# Patient Record
Sex: Female | Born: 1985
Health system: Southern US, Community
[De-identification: ages and names within clinical notes are randomized; demographics above are authoritative.]

## PROBLEM LIST (undated history)

## (undated) ENCOUNTER — Inpatient Hospital Stay (HOSPITAL_COMMUNITY): Payer: Self-pay

## (undated) DIAGNOSIS — T7840XA Allergy, unspecified, initial encounter: Secondary | ICD-10-CM

## (undated) DIAGNOSIS — E079 Disorder of thyroid, unspecified: Secondary | ICD-10-CM

## (undated) DIAGNOSIS — K219 Gastro-esophageal reflux disease without esophagitis: Secondary | ICD-10-CM

## (undated) DIAGNOSIS — L509 Urticaria, unspecified: Secondary | ICD-10-CM

## (undated) DIAGNOSIS — T783XXA Angioneurotic edema, initial encounter: Secondary | ICD-10-CM

## (undated) HISTORY — DX: Disorder of thyroid, unspecified: E07.9

## (undated) HISTORY — DX: Urticaria, unspecified: L50.9

## (undated) HISTORY — DX: Allergy, unspecified, initial encounter: T78.40XA

## (undated) HISTORY — DX: Angioneurotic edema, initial encounter: T78.3XXA

## (undated) HISTORY — DX: Gastro-esophageal reflux disease without esophagitis: K21.9

---

## 2009-08-15 ENCOUNTER — Other Ambulatory Visit: Admission: RE | Admit: 2009-08-15 | Discharge: 2009-08-15 | Payer: Self-pay | Admitting: Obstetrics and Gynecology

## 2010-03-16 ENCOUNTER — Inpatient Hospital Stay (HOSPITAL_COMMUNITY): Admission: RE | Admit: 2010-03-16 | Discharge: 2010-03-18 | Payer: Self-pay | Admitting: Obstetrics & Gynecology

## 2010-03-16 ENCOUNTER — Ambulatory Visit: Payer: Self-pay | Admitting: Advanced Practice Midwife

## 2010-12-17 LAB — GLUCOSE, CAPILLARY
Glucose-Capillary: 165 mg/dL — ABNORMAL HIGH (ref 70–99)
Glucose-Capillary: 73 mg/dL (ref 70–99)
Glucose-Capillary: 77 mg/dL (ref 70–99)
Glucose-Capillary: 86 mg/dL (ref 70–99)

## 2010-12-17 LAB — GLUCOSE, RANDOM: Glucose, Bld: 174 mg/dL — ABNORMAL HIGH (ref 70–99)

## 2010-12-17 LAB — CBC
HCT: 34.2 % — ABNORMAL LOW (ref 36.0–46.0)
Hemoglobin: 11.9 g/dL — ABNORMAL LOW (ref 12.0–15.0)
Hemoglobin: 12.8 g/dL (ref 12.0–15.0)
MCHC: 34.9 g/dL (ref 30.0–36.0)
MCV: 95.6 fL (ref 78.0–100.0)
RBC: 3.88 MIL/uL (ref 3.87–5.11)
WBC: 7.6 10*3/uL (ref 4.0–10.5)

## 2012-06-03 ENCOUNTER — Other Ambulatory Visit (HOSPITAL_COMMUNITY)
Admission: RE | Admit: 2012-06-03 | Discharge: 2012-06-03 | Disposition: A | Discharge status: Home / Self Care | Payer: Medicaid Other | Source: Ambulatory Visit | Attending: Obstetrics and Gynecology | Admitting: Obstetrics and Gynecology

## 2012-06-03 DIAGNOSIS — Z01419 Encounter for gynecological examination (general) (routine) without abnormal findings: Secondary | ICD-10-CM | POA: Insufficient documentation

## 2012-06-03 DIAGNOSIS — Z113 Encounter for screening for infections with a predominantly sexual mode of transmission: Secondary | ICD-10-CM | POA: Insufficient documentation

## 2012-06-03 LAB — OB RESULTS CONSOLE ANTIBODY SCREEN: Antibody Screen: NEGATIVE

## 2012-06-03 LAB — OB RESULTS CONSOLE GC/CHLAMYDIA: Gonorrhea: NEGATIVE

## 2012-06-03 LAB — OB RESULTS CONSOLE VARICELLA ZOSTER ANTIBODY, IGG: Varicella: IMMUNE

## 2012-10-01 NOTE — L&D Delivery Note (Signed)
Delivery Note At 12:26 PM a viable female was delivered via Vaginal, Spontaneous Delivery (Presentation: Left Occiput Anterior, w/ L hand presentation by face).  APGAR: 8 ,9 ; weight "pending" .   Placenta status: Intact, Spontaneous.  Cord: 3 vessels with the following complications: Short.   Anesthesia: None  Episiotomy: none Lacerations: 1st degree perineal Suture Repair: 3.0 vicryl Est. Blood Loss (mL): 200  Mom to postpartum.  Baby to nursery-stable.  Liberty Seto, MD Family Medicine Resident PGY-2 01/11/2013, 12:48 PM

## 2012-10-01 NOTE — L&D Delivery Note (Signed)
I was present for entire delivery and agree with above resident note. No complications. Small 1st degree tear, mostly superficial.  Napoleon Form, MD

## 2012-12-03 ENCOUNTER — Encounter: Payer: Self-pay | Admitting: *Deleted

## 2012-12-22 ENCOUNTER — Ambulatory Visit (INDEPENDENT_AMBULATORY_CARE_PROVIDER_SITE_OTHER): Payer: Medicaid Other | Admitting: Obstetrics & Gynecology

## 2012-12-22 ENCOUNTER — Encounter: Payer: Self-pay | Admitting: Obstetrics & Gynecology

## 2012-12-22 VITALS — BP 98/64 | Wt 140.0 lb

## 2012-12-22 DIAGNOSIS — Z1389 Encounter for screening for other disorder: Secondary | ICD-10-CM

## 2012-12-22 DIAGNOSIS — Z3483 Encounter for supervision of other normal pregnancy, third trimester: Secondary | ICD-10-CM

## 2012-12-22 DIAGNOSIS — O09299 Supervision of pregnancy with other poor reproductive or obstetric history, unspecified trimester: Secondary | ICD-10-CM

## 2012-12-22 DIAGNOSIS — O321XX0 Maternal care for breech presentation, not applicable or unspecified: Secondary | ICD-10-CM

## 2012-12-22 DIAGNOSIS — O99810 Abnormal glucose complicating pregnancy: Secondary | ICD-10-CM

## 2012-12-22 DIAGNOSIS — O0993 Supervision of high risk pregnancy, unspecified, third trimester: Secondary | ICD-10-CM

## 2012-12-22 DIAGNOSIS — Z331 Pregnant state, incidental: Secondary | ICD-10-CM

## 2012-12-22 DIAGNOSIS — O329XX0 Maternal care for malpresentation of fetus, unspecified, not applicable or unspecified: Secondary | ICD-10-CM

## 2012-12-22 LAB — POCT URINALYSIS DIPSTICK
Ketones, UA: NEGATIVE
Nitrite, UA: NEGATIVE
Protein, UA: NEGATIVE

## 2012-12-22 NOTE — Patient Instructions (Signed)
Breech Delivery A breech birth is when a baby is born with the buttocks or the feet first. Most babies are in a head down (vertex) position when they are born. Many babies are breech early in the pregnancy but turn to headfirst position toward the end of the pregnancy. There are three types of breech babies that can pose a risk to the baby and may need delivery by cesarean delivery. These include:  When the baby's buttocks is showing first in the birth canal (vagina) with the legs straight up and the feet at the baby's head (frank breech).  When the baby's buttocks shows first with the legs bent at the knees and the feet down near the buttocks (complete breech).  When one or both of the baby's feet are down below the buttocks (footling breech). RISKS WITH BREECH DELIVERIES:  Umbilical cord prolapse. This is when the umbilical cord is in front of the baby before or during labor.  Injury to the nerves in the shoulder, arm and hand (Brachial Plexus injury) when delivered.  Birth defects, overly large head (hydrocephalic) and neural tube defects (spina bifida) are seen more often.  Premature babies are seen more often. These are babies that are born too early.  There is an increase rate for cesarean delivery with breech babies. CAUSE OF BREECH PREGNANCY:  The mother has had several babies.  Having twins or more.  The uterus is abnormal in shape and size (double uterus).  The baby weighs less than 5 pounds.  There is too much or not enough amniotic fluid.  There is a tumor in the uterus.  All or part of the placenta covers the opening of the uterus (placenta previa). DIAGNOSIS  When you are close to your due date, your caregiver can tell if your baby is breech by an abdominal or vaginal exam, an X-ray or ultrasound, or any combination both. TREATMENT   More problems are likely if the baby is breech, but some babies may be delivered safely without a cesarean delivery. A cesarean  delivery also has risks such as longer hospital stays, blood clots, infection or bleeding. A breech delivery can compress the umbilical cord or cut off the blood supply to the baby. This can cause brain damage or death to the baby.  You and your caregiver will discuss the best way to deliver the baby, in your particular circumstance.  Your caregiver may try to turn the baby in your uterus. This is done towards the end of a normal pregnancy with your caregiver placing both hands on the abdomen, gently and slowly turning the baby around. This is a procedure called external cephalic version (ECV). If this procedure is successful, and the baby stays head down, a normal delivery is more likely.  Some doctors will plan to deliver a breech baby by cesarean delivery. An ECV should be attempted only when:  The pregnancy is more than [redacted] weeks along.  The breech should be confirmed with an ultrasound.  ECV should only be done in a delivery/surgical room with an anesthesiologist, surgical nurses and pediatric nursery nurses and preferably a pediatrician present. All should be ready for an emergency cesarean delivery if necessary.  A medication is given before the ECV is attempted to relax the muscles of the uterus.  A nonstress test on the baby should be normal.  A biophysical profile on the baby should be normal.  The baby should have continuous monitoring during the ECV.  Using regional anesthesia (epidural) helps   the outcome of the ECV to be more successful.  The baby should be monitored for 1 to 2 hours after the procedure. The benefits of ECV are:   Lower risk to the baby when delivered head first.  Lower cost.  Decrease in rate of cesarean delivery. The ECV should not be used if you have:  Vaginal bleeding.  Placenta previa.  A nonreactive nonstress test of the baby.  An abnormal biophysical profile of the baby.  An abnormally small baby.  A low level of fluid in the sac that  surrounds and protects the baby.  An abnormal fetal heart rate.  Early rupture of the membranes.  Twins or other multiple pregnancy.  An abnormal shape uterus, tumor or genetic defect (double uterus). More than half of external cephalic versions work. Even when they work, there is always a chance that the baby will turn back to the breech position.  HOME CARE INSTRUCTIONS  When you are pregnant:  See your caregiver regularly.  Take your prenatal vitamins as suggested.  Eat a well-balanced diet and exercise regularly unless instructed otherwise.  Get plenty of rest and sleep. After you have the baby:  You may have a small amount of bleeding for a week or so.  You may have some cramping of the uterus, especially if you are nursing.  Do not have sexual intercourse until your caregiver says it is okay.  Do not use tampons or douche.  Do not take aspirin because it can cause bleeding.  If you had a cesarean delivery, you may have some pain in the cut (incision). Your caregiver will advise you or give you pain medication.  Do not lift anything over 5 pounds.  Try to have help at home for 2 to 3 weeks.  Keep all your post delivery appointments. SEEK IMMEDIATE MEDICAL CARE IF:  Before you have the baby:  You have vaginal bleeding.  You are leaking fluid or have a gush of fluid from the vagina.  You develop uterine contractions.  You develop a temperature of 100 F (37.8 C) or higher.  You feel the baby is not moving or is moving less than normal. After you have the baby:  You develop a temperature of 100 F (37.8 C) or higher.  You have heavy vaginal bleeding.  You develop abnormal vaginal discharge.  You have pain when you urinate.  You become light headed or faint.  You have leg pain, chest pain or shortness of breath.  If you had a cesarean delivery and develop redness and swelling around the incision. You see fluid (pus) coming from the incision. Call  your caregiver if you think you are developing any problems during the pregnancy or after you have the baby. Document Released: 11/08/2006 Document Revised: 12/10/2011 Document Reviewed: 05/05/2008 Exit[employer] Patient Information 2013 [employer].  

## 2012-12-22 NOTE — Progress Notes (Signed)
Patient reports good fetal movement, denies any bleeding and no rupture of membranes symptoms or contraction. Breech, scheduled for external cephalic version 1/61/0960  No other complaints.

## 2012-12-22 NOTE — Progress Notes (Signed)
Swelling in hands and feet. 

## 2012-12-24 ENCOUNTER — Observation Stay (HOSPITAL_COMMUNITY)
Admission: AD | Admit: 2012-12-24 | Discharge: 2012-12-24 | Disposition: A | Discharge status: Home / Self Care | Payer: Medicaid Other | Source: Ambulatory Visit | Attending: Obstetrics & Gynecology | Admitting: Obstetrics & Gynecology

## 2012-12-24 DIAGNOSIS — O329XX3 Maternal care for malpresentation of fetus, unspecified, fetus 3: Secondary | ICD-10-CM

## 2012-12-24 DIAGNOSIS — O321XX0 Maternal care for breech presentation, not applicable or unspecified: Principal | ICD-10-CM | POA: Insufficient documentation

## 2012-12-24 DIAGNOSIS — O329XX0 Maternal care for malpresentation of fetus, unspecified, not applicable or unspecified: Secondary | ICD-10-CM

## 2012-12-24 DIAGNOSIS — Z3483 Encounter for supervision of other normal pregnancy, third trimester: Secondary | ICD-10-CM

## 2012-12-24 DIAGNOSIS — IMO0002 Reserved for concepts with insufficient information to code with codable children: Secondary | ICD-10-CM

## 2012-12-24 LAB — CBC
HCT: 33.5 % — ABNORMAL LOW (ref 36.0–46.0)
MCH: 28.4 pg (ref 26.0–34.0)
MCV: 84.8 fL (ref 78.0–100.0)
RBC: 3.95 MIL/uL (ref 3.87–5.11)
WBC: 8 10*3/uL (ref 4.0–10.5)

## 2012-12-24 MED ORDER — TERBUTALINE SULFATE 1 MG/ML IJ SOLN
0.2500 mg | Freq: Once | INTRAMUSCULAR | Status: AC
  Administered 2012-12-24: 0.25 mg via SUBCUTANEOUS

## 2012-12-24 MED ORDER — TERBUTALINE SULFATE 1 MG/ML IJ SOLN: 0.2500 mg | Freq: Once | INTRAMUSCULAR | Status: DC

## 2012-12-24 MED ORDER — TERBUTALINE SULFATE 1 MG/ML IJ SOLN
INTRAMUSCULAR | Status: AC
  Administered 2012-12-24: 1 mg
  Filled 2012-12-24: qty 1

## 2012-12-24 MED ORDER — LACTATED RINGERS IV SOLN
INTRAVENOUS | Status: DC
  Administered 2012-12-24: 19:00:00 via INTRAVENOUS

## 2012-12-24 NOTE — Progress Notes (Signed)
Discharge instructions reviewed with patient.

## 2012-12-24 NOTE — Progress Notes (Signed)
PIV in left wrist removed.  PIV started on previous shift.

## 2012-12-24 NOTE — Progress Notes (Signed)
External cephalic version performed in the usual fashion as a forward roll.  NST reassuring. No complications.  Will monitor at least an hour and discharge if stable.  Keep appt in office next week.

## 2012-12-24 NOTE — H&P (Signed)
Johnnae Impastato is a 27 y.o. female [phone] @ [redacted]w[redacted]d found to be breech in office 2 days ago.  Here for external cephalic version. History OB History   Grav Para Term Preterm Abortions TAB SAB Ect Mult Living   4 2 2  1  1   2      No past medical history on file. No past surgical history on file. Family History: family history is not on file. Social History:  reports that she has never smoked. She does not have any smokeless tobacco history on file. She reports that  drinks alcohol. She reports that she does not use illicit drugs.    ROS    Blood pressure 106/63, pulse 101, temperature 97.6 F (36.4 C), temperature source Oral, resp. rate 18, height 5' (1.524 m), weight 140 lb (63.504 kg), last menstrual period 03/19/2012, SpO2 100.00%. Exam Physical Exam  Prenatal labs: ABO, Rh: O/Positive/-- (09/03 0000) Antibody: Negative (09/03 0000) Rubella: Immune (09/03 0000) RPR: Nonreactive (09/03 0000)  HBsAg: Negative (09/03 0000)  HIV: Non-reactive (09/03 0000)  GBS:      I performed bedside sonogram and confirmed breech, frank, fundal placenta, good fluid.  NST is reactive. Assessment/Plan: 37 4/[redacted] weeks gestation , breech   EURE,LUTHER H 12/24/2012, 8:53 PM

## 2012-12-24 NOTE — Discharge Summary (Signed)
Patient in for external cephalic version at 37+ weeks, successful  Reactive NST after procedure.  No contractions  Pt to o home and follow up as scheduled next Monday in office.  EURE,LUTHER H 12/24/2012 10:28 PM

## 2012-12-26 LAB — OB RESULTS CONSOLE GBS: GBS: NEGATIVE

## 2012-12-27 ENCOUNTER — Inpatient Hospital Stay (HOSPITAL_COMMUNITY)
Admission: AD | Admit: 2012-12-27 | Discharge: 2012-12-28 | Disposition: A | Discharge status: Home / Self Care | Payer: Medicaid Other | Source: Ambulatory Visit | Attending: Obstetrics & Gynecology | Admitting: Obstetrics & Gynecology

## 2012-12-27 ENCOUNTER — Encounter (HOSPITAL_COMMUNITY): Payer: Self-pay | Admitting: *Deleted

## 2012-12-27 DIAGNOSIS — O479 False labor, unspecified: Secondary | ICD-10-CM | POA: Insufficient documentation

## 2012-12-27 DIAGNOSIS — O2390 Unspecified genitourinary tract infection in pregnancy, unspecified trimester: Secondary | ICD-10-CM | POA: Insufficient documentation

## 2012-12-27 DIAGNOSIS — N390 Urinary tract infection, site not specified: Secondary | ICD-10-CM | POA: Insufficient documentation

## 2012-12-27 DIAGNOSIS — O2343 Unspecified infection of urinary tract in pregnancy, third trimester: Secondary | ICD-10-CM

## 2012-12-27 DIAGNOSIS — Z3481 Encounter for supervision of other normal pregnancy, first trimester: Secondary | ICD-10-CM

## 2012-12-27 DIAGNOSIS — O99810 Abnormal glucose complicating pregnancy: Secondary | ICD-10-CM | POA: Insufficient documentation

## 2012-12-27 LAB — URINALYSIS, ROUTINE W REFLEX MICROSCOPIC
Bilirubin Urine: NEGATIVE
Hgb urine dipstick: NEGATIVE
Ketones, ur: NEGATIVE mg/dL
Specific Gravity, Urine: <1.005 — ABNORMAL LOW (ref 1.005–1.030)
pH: 5.5 (ref 5.0–8.0)

## 2012-12-27 LAB — URINE MICROSCOPIC-ADD ON

## 2012-12-27 NOTE — MAU Note (Signed)
Patient complains of not being able to urinate since last night around 11pm. Contractions since Wednesday, every 3 mins on the way to the hospital.

## 2012-12-28 ENCOUNTER — Encounter (HOSPITAL_COMMUNITY): Payer: Self-pay | Admitting: Family

## 2012-12-28 ENCOUNTER — Inpatient Hospital Stay (HOSPITAL_COMMUNITY): Payer: Medicaid Other

## 2012-12-28 ENCOUNTER — Inpatient Hospital Stay (EMERGENCY_DEPARTMENT_HOSPITAL)
Admission: AD | Admit: 2012-12-28 | Discharge: 2012-12-28 | Disposition: A | Discharge status: Home / Self Care | Payer: Medicaid Other | Source: Ambulatory Visit | Attending: Obstetrics & Gynecology | Admitting: Obstetrics & Gynecology

## 2012-12-28 DIAGNOSIS — O4690 Antepartum hemorrhage, unspecified, unspecified trimester: Secondary | ICD-10-CM

## 2012-12-28 DIAGNOSIS — O4693 Antepartum hemorrhage, unspecified, third trimester: Secondary | ICD-10-CM

## 2012-12-28 DIAGNOSIS — N390 Urinary tract infection, site not specified: Secondary | ICD-10-CM

## 2012-12-28 LAB — WET PREP, GENITAL
Clue Cells Wet Prep HPF POC: NONE SEEN
Trich, Wet Prep: NONE SEEN
Yeast Wet Prep HPF POC: NONE SEEN

## 2012-12-28 MED ORDER — NITROFURANTOIN MONOHYD MACRO 100 MG PO CAPS: 100.0000 mg | ORAL_CAPSULE | Freq: Two times a day (BID) | ORAL | Status: DC

## 2012-12-28 NOTE — MAU Provider Note (Signed)
  History     CSN: [id number]  Arrival date and time: 12/28/12 2002   None     Chief Complaint  Patient presents with  . Vaginal Bleeding   HPI 27 y.o. [id number] at [redacted]w[redacted]d with vaginal bleeding this afternoon. Mild contractions, irregular. Bleeding was bright red, happened after voiding, spot on pad about 4-5 cm. Has had some blood when wiping since then. Baby moving well. No loss of fluid.  Gets care at [facility], next appt tomorrow. Was 3 cm last week. Seen in MAU last night with contractions q 3 minutes, was 4 cm dilated. Next appt tomorrow at [facility]. OB history significant for GDM in prior pregnancy with 9 lb baby, A1 GDM this pregnancy, well controlled.  OB History   Grav Para Term Preterm Abortions TAB SAB Ect Mult Living   4 2 2  1  1   2       Past Medical History  Diagnosis Date  . Gestational diabetes     History reviewed. No pertinent past surgical history.  History reviewed. No pertinent family history.  History  Substance Use Topics  . Smoking status: Never Smoker   . Smokeless tobacco: Not on file  . Alcohol Use: Yes     Comment: beer on holidays. not when preg.    Allergies: No Known Allergies  Prescriptions prior to admission  Medication Sig Dispense Refill  . nitrofurantoin, macrocrystal-monohydrate, (MACROBID) 100 MG capsule Take 100 mg by mouth 2 (two) times daily.      . Prenatal Vit-Fe Fumarate-FA (PRENATAL MULTIVITAMIN) TABS Take 1 tablet by mouth daily at 12 noon.      Marland Kitchen PRESCRIPTION MEDICATION Take 1 tablet by mouth daily. Cold medication        ROS  As per HPI Physical Exam   Blood pressure 103/41, pulse 87, temperature 97.9 F (36.6 C), temperature source Oral, resp. rate 18, last menstrual period 03/19/2012.  Physical Exam  Constitutional: She is oriented to person, place, and time. She appears well-developed and well-nourished. No distress.  HENT:  Head: Normocephalic and atraumatic.  Eyes: Conjunctivae and EOM are normal.   Neck: Normal range of motion. Neck supple.  Cardiovascular: Normal rate.   Respiratory: Effort normal. No respiratory distress.  GI: Soft. There is no tenderness. There is no rebound and no guarding.  Genitourinary:  Normal external genitalia and vagina. No blood in vaginal vault, moderate normal white/clear discharge. Cervix about 1 cm visually. Digital exam:  4 cm, 50%, -2, posterior. No blood visualized in vagina, at cervix or on glove.  Musculoskeletal: Normal range of motion. She exhibits no edema and no tenderness.  Neurological: She is alert and oriented to person, place, and time.  Skin: Skin is warm and dry.  Psychiatric: She has a normal mood and affect.     MAU Course  Procedures  FHTs:  135, moderate variability, accels present, no decels:  Cat I TOCO: ctx q 10 or less, irregular  Assessment and Plan  26 y.o. [id number] at [redacted]w[redacted]d with vaginal bleeding - Bloody show, checked 3 times last night - FHTs reactive - No cerivcal change since last night with irregular, mild ctx - Not in labor - Discussed signs/symptoms of labor - F/[facility] at [facility] tomorrrow.   Napoleon Form 12/28/2012, 9:02 PM

## 2012-12-28 NOTE — MAU Provider Note (Signed)
Attestation of Attending Supervision of Advanced Practitioner (PA/CNM/NP): Evaluation and management procedures were performed by the Advanced Practitioner under my supervision and collaboration.  I have reviewed the Advanced Practitioner's note and chart, and I agree with the management and plan.  Marlen Mollica, MD, FACOG Attending Obstetrician & Gynecologist Faculty Practice, [facility]  

## 2012-12-28 NOTE — MAU Note (Addendum)
Patient presents to MAU with c/o vaginal bleeding since 1930. Reports good fetal movement. Denies no trauma or sexual intercourse today.

## 2012-12-28 NOTE — MAU Note (Signed)
Pt G4 P2 at 38.1wks reports vag bleeding.

## 2012-12-28 NOTE — MAU Provider Note (Signed)
History     CSN: [id number]  Arrival date and time: 12/27/12 2140   First Provider Initiated Contact with Patient 12/28/12 0004      Chief Complaint  Patient presents with  . Urinary Retention  . Contractions   HPI  Pt is a G4P2012 at 38.1 wk IUP here with report of difficulty urinating since 2300 last night.  Reports intermittent contraction.  No report of vaginal bleeding.  +vaginal discharge that fluctuates between thin and thick.  +fetal movement.  Reports diet controlled. GDM.    Past Medical History  Diagnosis Date  . Gestational diabetes     History reviewed. No pertinent past surgical history.  History reviewed. No pertinent family history.  History  Substance Use Topics  . Smoking status: Never Smoker   . Smokeless tobacco: Not on file  . Alcohol Use: Yes     Comment: beer on holidays. not when preg.    Allergies: No Known Allergies  Prescriptions prior to admission  Medication Sig Dispense Refill  . Prenatal Vit-Fe Fumarate-FA (PRENATAL MULTIVITAMIN) TABS Take 1 tablet by mouth daily at 12 noon.      Marland Kitchen PRESCRIPTION MEDICATION Take 1 tablet by mouth daily. Patient was taking med for cold 3 weeks ago, does not know strength or name. Called pharmacy where was filled no answer.        Review of Systems  Gastrointestinal: Positive for abdominal pain (contractions).  Genitourinary:       Thick/thin vaginal discharge  All other systems reviewed and are negative.   Physical Exam   Blood pressure 105/69, pulse 78, temperature 97.7 F (36.5 C), temperature source Oral, resp. rate 18, height 5' (1.524 m), weight 65.318 kg (144 lb), last menstrual period 03/19/2012, SpO2 100.00%.  Physical Exam  Constitutional: She is oriented to person, place, and time. She appears well-developed and well-nourished. No distress.  HENT:  Head: Normocephalic.  Neck: Normal range of motion. Neck supple.  Cardiovascular: Normal rate, regular rhythm and normal heart sounds.    Respiratory: Effort normal and breath sounds normal.  Genitourinary: No bleeding around the vagina. Vaginal discharge (mucusy) found.  Neurological: She is alert and oriented to person, place, and time.  Skin: Skin is warm and dry.    MAU Course  Procedures  Results for orders placed during the hospital encounter of 12/27/12 (from the past 24 hour(s))  URINALYSIS, ROUTINE W REFLEX MICROSCOPIC     Status: Abnormal   Collection Time    12/27/12  9:50 PM      Result Value Range   Color, Urine YELLOW  YELLOW   APPearance CLEAR  CLEAR   Specific Gravity, Urine <1.005 (*) 1.005 - 1.030   pH 5.5  5.0 - 8.0   Glucose, UA NEGATIVE  NEGATIVE mg/dL   Hgb urine dipstick NEGATIVE  NEGATIVE   Bilirubin Urine NEGATIVE  NEGATIVE   Ketones, ur NEGATIVE  NEGATIVE mg/dL   Protein, ur NEGATIVE  NEGATIVE mg/dL   Urobilinogen, UA 0.2  0.0 - 1.0 mg/dL   Nitrite NEGATIVE  NEGATIVE   Leukocytes, UA MODERATE (*) NEGATIVE  URINE MICROSCOPIC-ADD ON     Status: None   Collection Time    12/27/12  9:50 PM      Result Value Range   Squamous Epithelial / LPF RARE  RARE   WBC, UA 7-10  <3 WBC/hpf   RBC / HPF 0-2  <3 RBC/hpf   Bacteria, UA RARE  RARE  WET PREP, GENITAL  Status: Abnormal   Collection Time    12/28/12 12:10 AM      Result Value Range   Yeast Wet Prep HPF POC NONE SEEN  NONE SEEN   Trich, Wet Prep NONE SEEN  NONE SEEN   Clue Cells Wet Prep HPF POC NONE SEEN  NONE SEEN   WBC, Wet Prep HPF POC MODERATE (*) NONE SEEN   Ultrasound:  Vertex AFI:  11.96 Dilation: 4 Effacement (%): 50 Cervical Position: Middle Station: -2 Presentation: Vertex Exam by:: L. Munford RN  Cervix recheck - no change Assessment and Plan  Urinary Tract Infection Category I FHR Tracing Early Labor  Plan: DC to home RX Macrobid Urine Culture Labor precautions Keep scheduled appt for Monday.   [facility] 12/28/2012, 12:06 AM

## 2012-12-29 ENCOUNTER — Ambulatory Visit (INDEPENDENT_AMBULATORY_CARE_PROVIDER_SITE_OTHER): Payer: Medicaid Other | Admitting: Obstetrics & Gynecology

## 2012-12-29 ENCOUNTER — Encounter: Payer: Self-pay | Admitting: Obstetrics & Gynecology

## 2012-12-29 VITALS — BP 112/80 | Wt 143.0 lb

## 2012-12-29 DIAGNOSIS — O09299 Supervision of pregnancy with other poor reproductive or obstetric history, unspecified trimester: Secondary | ICD-10-CM

## 2012-12-29 DIAGNOSIS — Z3483 Encounter for supervision of other normal pregnancy, third trimester: Secondary | ICD-10-CM

## 2012-12-29 DIAGNOSIS — O99810 Abnormal glucose complicating pregnancy: Secondary | ICD-10-CM

## 2012-12-29 DIAGNOSIS — Z1389 Encounter for screening for other disorder: Secondary | ICD-10-CM

## 2012-12-29 DIAGNOSIS — Z331 Pregnant state, incidental: Secondary | ICD-10-CM

## 2012-12-29 LAB — URINE CULTURE

## 2012-12-29 LAB — POCT URINALYSIS DIPSTICK
Blood, UA: NEGATIVE
Glucose, UA: NEGATIVE
Leukocytes, UA: NEGATIVE
Nitrite, UA: NEGATIVE

## 2012-12-29 NOTE — Patient Instructions (Signed)
Pregnancy - Third Trimester  The third trimester of pregnancy (the last 3 months) is a period of the most rapid growth for you and your baby. The baby approaches a length of 20 inches and a weight of 6 to 10 pounds. The baby is adding on fat and getting ready for life outside your body. While inside, babies have periods of sleeping and waking, suck their thumbs, and hiccups. You can often feel small contractions of the uterus. This is false labor. It is also called Braxton-Hicks contractions. This is like a practice for labor. The usual problems in this stage of pregnancy include more difficulty breathing, swelling of the hands and feet from water retention, and having to urinate more often because of the uterus and baby pressing on your bladder.   PRENATAL EXAMS  · Blood work may continue to be done during prenatal exams. These tests are done to check on your health and the probable health of your baby. Blood work is used to follow your blood levels (hemoglobin). Anemia (low hemoglobin) is common during pregnancy. Iron and vitamins are given to help prevent this. You may also continue to be checked for diabetes. Some of the past blood tests may be done again.  · The size of the uterus is measured during each visit. This makes sure your baby is growing properly according to your pregnancy dates.  · Your blood pressure is checked every prenatal visit. This is to make sure you are not getting toxemia.  · Your urine is checked every prenatal visit for infection, diabetes and protein.  · Your weight is checked at each visit. This is done to make sure gains are happening at the suggested rate and that you and your baby are growing normally.  · Sometimes, an ultrasound is performed to confirm the position and the proper growth and development of the baby. This is a test done that bounces harmless sound waves off the baby so your caregiver can more accurately determine due dates.  · Discuss the type of pain medication and  anesthesia you will have during your labor and delivery.  · Discuss the possibility and anesthesia if a Cesarean Section might be necessary.  · Inform your caregiver if there is any mental or physical violence at home.  Sometimes, a specialized non-stress test, contraction stress test and biophysical profile are done to make sure the baby is not having a problem. Checking the amniotic fluid surrounding the baby is called an amniocentesis. The amniotic fluid is removed by sticking a needle into the belly (abdomen). This is sometimes done near the end of pregnancy if an early delivery is required. In this case, it is done to help make sure the baby's lungs are mature enough for the baby to live outside of the womb. If the lungs are not mature and it is unsafe to deliver the baby, an injection of cortisone medication is given to the mother 1 to 2 days before the delivery. This helps the baby's lungs mature and makes it safer to deliver the baby.  CHANGES OCCURING IN THE THIRD TRIMESTER OF PREGNANCY  Your body goes through many changes during pregnancy. They vary from person to person. Talk to your caregiver about changes you notice and are concerned about.  · During the last trimester, you have probably had an increase in your appetite. It is normal to have cravings for certain foods. This varies from person to person and pregnancy to pregnancy.  · You may begin to   get stretch marks on your hips, abdomen, and breasts. These are normal changes in the body during pregnancy. There are no exercises or medications to take which prevent this change.  · Constipation may be treated with a stool softener or adding bulk to your diet. Drinking lots of fluids, fiber in vegetables, fruits, and whole grains are helpful.  · Exercising is also helpful. If you have been very active up until your pregnancy, most of these activities can be continued during your pregnancy. If you have been less active, it is helpful to start an exercise  program such as walking. Consult your caregiver before starting exercise programs.  · Avoid all smoking, alcohol, un-prescribed drugs, herbs and "street drugs" during your pregnancy. These chemicals affect the formation and growth of the baby. Avoid chemicals throughout the pregnancy to ensure the delivery of a healthy infant.  · Backache, varicose veins and hemorrhoids may develop or get worse.  · You will tire more easily in the third trimester, which is normal.  · The baby's movements may be stronger and more often.  · You may become short of breath easily.  · Your belly button may stick out.  · A yellow discharge may leak from your breasts called colostrum.  · You may have a bloody mucus discharge. This usually occurs a few days to a week before labor begins.  HOME CARE INSTRUCTIONS   · Keep your caregiver's appointments. Follow your caregiver's instructions regarding medication use, exercise, and diet.  · During pregnancy, you are providing food for you and your baby. Continue to eat regular, well-balanced meals. Choose foods such as meat, fish, milk and other low fat dairy products, vegetables, fruits, and whole-grain breads and cereals. Your caregiver will tell you of the ideal weight gain.  · A physical sexual relationship may be continued throughout pregnancy if there are no other problems such as early (premature) leaking of amniotic fluid from the membranes, vaginal bleeding, or belly (abdominal) pain.  · Exercise regularly if there are no restrictions. Check with your caregiver if you are unsure of the safety of your exercises. Greater weight gain will occur in the last 2 trimesters of pregnancy. Exercising helps:  · Control your weight.  · Get you in shape for labor and delivery.  · You lose weight after you deliver.  · Rest a lot with legs elevated, or as needed for leg cramps or low back pain.  · Wear a good support or jogging bra for breast tenderness during pregnancy. This may help if worn during  sleep. Pads or tissues may be used in the bra if you are leaking colostrum.  · Do not use hot tubs, steam rooms, or saunas.  · Wear your seat belt when driving. This protects you and your baby if you are in an accident.  · Avoid raw meat, cat litter boxes and soil used by cats. These carry germs that can cause birth defects in the baby.  · It is easier to loose urine during pregnancy. Tightening up and strengthening the pelvic muscles will help with this problem. You can practice stopping your urination while you are going to the bathroom. These are the same muscles you need to strengthen. It is also the muscles you would use if you were trying to stop from passing gas. You can practice tightening these muscles up 10 times a set and repeating this about 3 times per day. Once you know what muscles to tighten up, do not perform these   exercises during urination. It is more likely to cause an infection by backing up the urine.  · Ask for help if you have financial, counseling or nutritional needs during pregnancy. Your caregiver will be able to offer counseling for these needs as well as refer you for other special needs.  · Make a list of emergency phone numbers and have them available.  · Plan on getting help from family or friends when you go home from the hospital.  · Make a trial run to the hospital.  · Take prenatal classes with the father to understand, practice and ask questions about the labor and delivery.  · Prepare the baby's room/nursery.  · Do not travel out of the city unless it is absolutely necessary and with the advice of your caregiver.  · Wear only low or no heal shoes to have better balance and prevent falling.  MEDICATIONS AND DRUG USE IN PREGNANCY  · Take prenatal vitamins as directed. The vitamin should contain 1 milligram of folic acid. Keep all vitamins out of reach of children. Only a couple vitamins or tablets containing iron may be fatal to a baby or young child when ingested.  · Avoid use  of all medications, including herbs, over-the-counter medications, not prescribed or suggested by your caregiver. Only take over-the-counter or prescription medicines for pain, discomfort, or fever as directed by your caregiver. Do not use aspirin, ibuprofen (Motrin®, Advil®, Nuprin®) or naproxen (Aleve®) unless OK'd by your caregiver.  · Let your caregiver also know about herbs you may be using.  · Alcohol is related to a number of birth defects. This includes fetal alcohol syndrome. All alcohol, in any form, should be avoided completely. Smoking will cause low birth rate and premature babies.  · Street/illegal drugs are very harmful to the baby. They are absolutely forbidden. A baby born to an addicted mother will be addicted at birth. The baby will go through the same withdrawal an adult does.  SEEK MEDICAL CARE IF:  You have any concerns or worries during your pregnancy. It is better to call with your questions if you feel they cannot wait, rather than worry about them.  DECISIONS ABOUT CIRCUMCISION  You may or may not know the sex of your baby. If you know your baby is a boy, it may be time to think about circumcision. Circumcision is the removal of the foreskin of the penis. This is the skin that covers the sensitive end of the penis. There is no proven medical need for this. Often this decision is made on what is popular at the time or based upon religious beliefs and social issues. You can discuss these issues with your caregiver or pediatrician.  SEEK IMMEDIATE MEDICAL CARE IF:   · An unexplained oral temperature above 102° F (38.9° C) develops, or as your caregiver suggests.  · You have leaking of fluid from the vagina (birth canal). If leaking membranes are suspected, take your temperature and tell your caregiver of this when you call.  · There is vaginal spotting, bleeding or passing clots. Tell your caregiver of the amount and how many pads are used.  · You develop a bad smelling vaginal discharge with  a change in the color from clear to white.  · You develop vomiting that lasts more than 24 hours.  · You develop chills or fever.  · You develop shortness of breath.  · You develop burning on urination.  · You loose more than 2 pounds of weight   or gain more than 2 pounds of weight or as suggested by your caregiver.  · You notice sudden swelling of your face, hands, and feet or legs.  · You develop belly (abdominal) pain. Round ligament discomfort is a common non-cancerous (benign) cause of abdominal pain in pregnancy. Your caregiver still must evaluate you.  · You develop a severe headache that does not go away.  · You develop visual problems, blurred or double vision.  · If you have not felt your baby move for more than 1 hour. If you think the baby is not moving as much as usual, eat something with sugar in it and lie down on your left side for an hour. The baby should move at least 4 to 5 times per hour. Call right away if your baby moves less than that.  · You fall, are in a car accident or any kind of trauma.  · There is mental or physical violence at home.  Document Released: 09/11/2001 Document Revised: 12/10/2011 Document Reviewed: 03/16/2009  [employer]® Patient Information ©2013 [employer].

## 2012-12-29 NOTE — Progress Notes (Signed)
Patient reports good fetal movement, denies any bleeding and no rupture of membranes symptoms,  Contractions irregular, pressure Labor precautions discussed.  All questions answered.

## 2012-12-29 NOTE — Progress Notes (Signed)
Having scant vaginal bleeding this morning.

## 2012-12-31 NOTE — [facility]U Provider Note (Signed)
Attestation of Attending Supervision of Advanced Practitioner (CNM/NP): Evaluation and management procedures were performed by the Advanced Practitioner under my supervision and collaboration. I have reviewed the Advanced Practitioner's note and chart, and I agree with the management and plan.  Zyasia Halbleib H. 11:11 AM

## 2013-01-05 ENCOUNTER — Encounter: Payer: Self-pay | Admitting: Obstetrics and Gynecology

## 2013-01-05 ENCOUNTER — Ambulatory Visit (INDEPENDENT_AMBULATORY_CARE_PROVIDER_SITE_OTHER): Payer: Medicaid Other | Admitting: Obstetrics and Gynecology

## 2013-01-05 ENCOUNTER — Other Ambulatory Visit: Payer: Self-pay | Admitting: Obstetrics and Gynecology

## 2013-01-05 ENCOUNTER — Encounter: Payer: Medicaid Other | Admitting: Obstetrics & Gynecology

## 2013-01-05 VITALS — BP 104/64 | Wt 136.8 lb

## 2013-01-05 DIAGNOSIS — Z331 Pregnant state, incidental: Secondary | ICD-10-CM

## 2013-01-05 DIAGNOSIS — O09299 Supervision of pregnancy with other poor reproductive or obstetric history, unspecified trimester: Secondary | ICD-10-CM

## 2013-01-05 DIAGNOSIS — O09899 Supervision of other high risk pregnancies, unspecified trimester: Secondary | ICD-10-CM

## 2013-01-05 DIAGNOSIS — O99810 Abnormal glucose complicating pregnancy: Secondary | ICD-10-CM

## 2013-01-05 DIAGNOSIS — Z1389 Encounter for screening for other disorder: Secondary | ICD-10-CM

## 2013-01-05 LAB — POCT URINALYSIS DIPSTICK
Nitrite, UA: NEGATIVE
Protein, UA: NEGATIVE

## 2013-01-05 NOTE — Patient Instructions (Signed)
Please come to [facility] at 7 am on Sunday for induction of labor.

## 2013-01-05 NOTE — H&P (Signed)
Cindy Thompson is a 27 y.o. female presenting for induction of labor at 40 weeks 2 days due to patient request. 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 She understands an induction of labor Terazol the normal risks of pregnancy labor and delivery including fetal complications bleeding or difficulty with delivery.. The patient refused induction on Friday, her due date History OB History   Grav Para Term Preterm Abortions TAB SAB Ect Mult Living   5 2 2  1  1   2      Past Medical History  Diagnosis Date  . Gestational diabetes    No past surgical history on file. Family History: family history is not on file. Social History:  reports that she has never smoked. She does not have any smokeless tobacco history on file. She reports that  drinks alcohol. She reports that she does not use illicit drugs.   Prenatal Transfer Tool  Maternal Diabetes: diet controlled Genetic Screening: Normal Maternal Ultrasounds/Referrals: Normal Fetal Ultrasounds or other Referrals:  None Maternal Substance Abuse:  No Significant Maternal Medications:  None Significant Maternal Lab Results:  Lab values include: Group B Strep negative Other Comments:  None  ROS    Last menstrual period 03/19/2012, unknown if currently breastfeeding. Exam Physical Exam Physical Examination: General appearance - alert, well appearing, and in no distress, oriented to person, place, and time and her and which is rather good she has excellent English comprehension though sometimes is difficult to follow her conversation and responses she is very talkative . Mental status - alert, oriented to person, place, and time, normal mood, behavior, speech, dress, motor activity, and thought processes Abdomen - soft, nontender, nondistended, no masses or organomegaly Gravid uterus consistent with dates 38 cm  fundal Pelvic - cervix 2 cm 50% -2 vertex presentation on 01/05/2013  Prenatal labs: ABO, Rh: O/Positive/-- (09/03 0000) Antibody: Negative (09/03 0000) Rubella: Immune (09/03 0000) RPR: Nonreactive (09/03 0000)  HBsAg: Negative (09/03 0000)  HIV: Non-reactive (09/03 0000)  GBS:   negative  Assessment/Plan: Pregnancy 40 weeks 2 days induction of labor, gestational diabetes A1 diet controlled Admitted for Pitocin induction of labor   Dyllan Kats V 01/05/2013, 5:47 PM

## 2013-01-05 NOTE — Progress Notes (Signed)
C/o contractions, pain and pressure lower abdomen

## 2013-01-05 NOTE — Progress Notes (Signed)
Pt having difficulty voiding due to pressure.  Out of work x 2 wk. Cervix favorable.  Will agree to requested IOL Sunday 7 am

## 2013-01-06 ENCOUNTER — Telehealth (HOSPITAL_COMMUNITY): Payer: Self-pay | Admitting: *Deleted

## 2013-01-06 ENCOUNTER — Encounter (HOSPITAL_COMMUNITY): Payer: Self-pay | Admitting: *Deleted

## 2013-01-06 NOTE — Telephone Encounter (Signed)
Preadmission screen  

## 2013-01-11 ENCOUNTER — Encounter (HOSPITAL_COMMUNITY): Payer: Self-pay

## 2013-01-11 ENCOUNTER — Inpatient Hospital Stay (HOSPITAL_COMMUNITY)
Admission: RE | Admit: 2013-01-11 | Discharge: 2013-01-13 | DRG: 775 | Disposition: A | Discharge status: Home / Self Care | Payer: Medicaid Other | Source: Ambulatory Visit | Attending: Obstetrics and Gynecology | Admitting: Obstetrics and Gynecology

## 2013-01-11 DIAGNOSIS — O99814 Abnormal glucose complicating childbirth: Secondary | ICD-10-CM

## 2013-01-11 LAB — CBC
MCH: 28.1 pg (ref 26.0–34.0)
MCHC: 33.1 g/dL (ref 30.0–36.0)
Platelets: 162 10*3/uL (ref 150–400)
RBC: 3.91 MIL/uL (ref 3.87–5.11)

## 2013-01-11 LAB — ABO/RH: ABO/RH(D): O POS

## 2013-01-11 LAB — GLUCOSE, RANDOM: Glucose, Bld: 151 mg/dL — ABNORMAL HIGH (ref 70–99)

## 2013-01-11 LAB — RPR: RPR Ser Ql: NONREACTIVE

## 2013-01-11 MED ORDER — WITCH HAZEL-GLYCERIN EX PADS: 1.0000 "application " | MEDICATED_PAD | CUTANEOUS | Status: DC | PRN

## 2013-01-11 MED ORDER — LACTATED RINGERS IV SOLN: 500.0000 mL | INTRAVENOUS | Status: DC | PRN

## 2013-01-11 MED ORDER — DIPHENHYDRAMINE HCL 25 MG PO CAPS: 25.0000 mg | ORAL_CAPSULE | Freq: Four times a day (QID) | ORAL | Status: DC | PRN

## 2013-01-11 MED ORDER — LACTATED RINGERS IV SOLN
INTRAVENOUS | Status: DC
  Administered 2013-01-11: 08:00:00 via INTRAVENOUS

## 2013-01-11 MED ORDER — TERBUTALINE SULFATE 1 MG/ML IJ SOLN: 0.2500 mg | Freq: Once | INTRAMUSCULAR | Status: DC | PRN

## 2013-01-11 MED ORDER — TETANUS-DIPHTH-ACELL PERTUSSIS 5-2.5-18.5 LF-MCG/0.5 IM SUSP
0.5000 mL | Freq: Once | INTRAMUSCULAR | Status: AC
  Administered 2013-01-13: 0.5 mL via INTRAMUSCULAR
  Filled 2013-01-11: qty 0.5

## 2013-01-11 MED ORDER — ONDANSETRON HCL 4 MG/2ML IJ SOLN: 4.0000 mg | Freq: Four times a day (QID) | INTRAMUSCULAR | Status: DC | PRN

## 2013-01-11 MED ORDER — SENNOSIDES-DOCUSATE SODIUM 8.6-50 MG PO TABS
2.0000 | ORAL_TABLET | Freq: Every day | ORAL | Status: DC
  Administered 2013-01-11 – 2013-01-12 (×2): 2 via ORAL

## 2013-01-11 MED ORDER — OXYCODONE-ACETAMINOPHEN 5-325 MG PO TABS
1.0000 | ORAL_TABLET | ORAL | Status: DC | PRN
Start: 2013-01-11 — End: 2013-01-13
  Administered 2013-01-11 – 2013-01-12 (×3): 1 via ORAL
  Filled 2013-01-11 (×3): qty 1

## 2013-01-11 MED ORDER — CITRIC ACID-SODIUM CITRATE 334-500 MG/5ML PO SOLN: 30.0000 mL | ORAL | Status: DC | PRN

## 2013-01-11 MED ORDER — IBUPROFEN 600 MG PO TABS
600.0000 mg | ORAL_TABLET | Freq: Four times a day (QID) | ORAL | Status: DC | PRN
  Administered 2013-01-11: 600 mg via ORAL
  Filled 2013-01-11: qty 1

## 2013-01-11 MED ORDER — OXYCODONE-ACETAMINOPHEN 5-325 MG PO TABS
1.0000 | ORAL_TABLET | ORAL | Status: DC | PRN
  Administered 2013-01-11: 1 via ORAL
  Filled 2013-01-11: qty 1

## 2013-01-11 MED ORDER — BENZOCAINE-MENTHOL 20-0.5 % EX AERO: 1.0000 "application " | INHALATION_SPRAY | CUTANEOUS | Status: DC | PRN

## 2013-01-11 MED ORDER — FENTANYL CITRATE 0.05 MG/ML IJ SOLN
100.0000 ug | INTRAMUSCULAR | Status: DC | PRN
  Administered 2013-01-11: 100 ug via INTRAVENOUS
  Filled 2013-01-11: qty 2

## 2013-01-11 MED ORDER — DIBUCAINE 1 % RE OINT: 1.0000 "application " | TOPICAL_OINTMENT | RECTAL | Status: DC | PRN

## 2013-01-11 MED ORDER — ONDANSETRON HCL 4 MG/2ML IJ SOLN: 4.0000 mg | INTRAMUSCULAR | Status: DC | PRN

## 2013-01-11 MED ORDER — LIDOCAINE HCL (PF) 1 % IJ SOLN
30.0000 mL | INTRAMUSCULAR | Status: DC | PRN
  Filled 2013-01-11 (×2): qty 30

## 2013-01-11 MED ORDER — ACETAMINOPHEN 325 MG PO TABS: 650.0000 mg | ORAL_TABLET | ORAL | Status: DC | PRN

## 2013-01-11 MED ORDER — PRENATAL MULTIVITAMIN CH
1.0000 | ORAL_TABLET | Freq: Every day | ORAL | Status: DC
  Administered 2013-01-11 – 2013-01-12 (×2): 1 via ORAL
  Filled 2013-01-11 (×2): qty 1

## 2013-01-11 MED ORDER — OXYTOCIN 40 UNITS IN LACTATED RINGERS INFUSION - SIMPLE MED: 62.5000 mL/h | INTRAVENOUS | Status: DC

## 2013-01-11 MED ORDER — SIMETHICONE 80 MG PO CHEW: 80.0000 mg | CHEWABLE_TABLET | ORAL | Status: DC | PRN

## 2013-01-11 MED ORDER — OXYTOCIN 40 UNITS IN LACTATED RINGERS INFUSION - SIMPLE MED
1.0000 m[IU]/min | INTRAVENOUS | Status: DC
  Administered 2013-01-11: 2 m[IU]/min via INTRAVENOUS
  Filled 2013-01-11: qty 1000

## 2013-01-11 MED ORDER — IBUPROFEN 600 MG PO TABS
600.0000 mg | ORAL_TABLET | Freq: Four times a day (QID) | ORAL | Status: DC
  Administered 2013-01-11 – 2013-01-13 (×7): 600 mg via ORAL
  Filled 2013-01-11 (×7): qty 1

## 2013-01-11 MED ORDER — FLEET ENEMA 7-19 GM/118ML RE ENEM: 1.0000 | ENEMA | RECTAL | Status: DC | PRN

## 2013-01-11 MED ORDER — LANOLIN HYDROUS EX OINT: TOPICAL_OINTMENT | CUTANEOUS | Status: DC | PRN

## 2013-01-11 MED ORDER — ONDANSETRON HCL 4 MG PO TABS: 4.0000 mg | ORAL_TABLET | ORAL | Status: DC | PRN

## 2013-01-11 MED ORDER — OXYTOCIN BOLUS FROM INFUSION: 500.0000 mL | INTRAVENOUS | Status: DC

## 2013-01-11 MED ORDER — ZOLPIDEM TARTRATE 5 MG PO TABS: 5.0000 mg | ORAL_TABLET | Freq: Every evening | ORAL | Status: DC | PRN

## 2013-01-11 NOTE — Progress Notes (Signed)
Cindy Thompson is a 27 y.o. [id number] at [redacted]w[redacted]d admitted for IOL  Subjective: Doing well. Contractions now much more painful  Objective: BP 112/61  Pulse 72  Temp(Src) 98 F (36.7 C) (Oral)  Resp 18  Ht 5' (1.524 m)  Wt 66.225 kg (146 lb)  BMI 28.51 kg/m2  LMP 03/19/2012      FHT:  FHR: 130s bpm, variability: moderate,  accelerations:  Present,  decelerations:  Absent UC:   regular, every 2 minutes SVE:   Dilation: 7 Effacement (%): 100 Station: -1 Exam by:: [employer]: Lab Results  Component Value Date   WBC 8.4 01/11/2013   HGB 11.0* 01/11/2013   HCT 33.2* 01/11/2013   MCV 84.9 01/11/2013   PLT 162 01/11/2013    Assessment / Plan: Augmentation of labor, progressing well , AROM  Labor: Progressing on Pit.  Fetal Wellbeing:  Category I Pain Control:  Fentanyl I/D:  GBS neg Anticipated MOD:  NSVD  Makayela Secrest, MD Family Medicine Resident PGY-2  01/11/2013, 11:35 AM

## 2013-01-11 NOTE — H&P (Signed)
Cindy Thompson is a 27 y.o. female presenting for IOL due to pt request w/ Type A1DM.  Maternal Medical History:  Contractions: Frequency: irregular.   Perceived severity is mild.    Fetal activity: Perceived fetal activity is normal.   Last perceived fetal movement was within the past hour.    Prenatal Complications - Diabetes: gestational. Diabetes is managed by diet.        OB History   Grav Para Term Preterm Abortions TAB SAB Ect Mult Living   4 2 2  1  1   2      Past Medical History  Diagnosis Date  . Gestational diabetes    No past surgical history on file. Family History: family history is not on file. Social History:  reports that she has never smoked. She has never used smokeless tobacco. She reports that  drinks alcohol. She reports that she does not use illicit drugs.   Prenatal Transfer Tool  Maternal Diabetes: Yes:  Diabetes Type:  Diet controlled Genetic Screening: Normal Maternal Ultrasounds/Referrals: Normal Fetal Ultrasounds or other Referrals:  None Maternal Substance Abuse:  No Significant Maternal Medications:  None Significant Maternal Lab Results:  Lab values include: Group B Strep negative Other Comments:  None  Review of Systems  Constitutional: Negative for fever.  Eyes: Negative for blurred vision.  Respiratory: Negative for shortness of breath.   Cardiovascular: Negative for chest pain.  Neurological: Negative for headaches.  All other systems reviewed and are negative.    Dilation: 5 Effacement (%): 70 Station: -1 Exam by:: Merrill Blood pressure 110/57, pulse 73, temperature 98 F (36.7 C), temperature source Oral, resp. rate 18, height 5' (1.524 m), weight 66.225 kg (146 lb), last menstrual period 03/19/2012, unknown if currently breastfeeding. Maternal Exam:  Uterine Assessment: Contraction strength is mild.  Contraction frequency is irregular.   Abdomen: Fundal height is Term.   Fetal presentation: vertex  Introitus: Normal vulva.  Vagina is negative for discharge.  Pelvis: adequate for delivery.   Cervix: Cervix evaluated by sterile speculum exam.     Fetal Exam Fetal State Assessment: Category I - tracings are normal.     Physical Exam  Constitutional: She is oriented to person, place, and time. She appears well-developed and well-nourished.  HENT:  Head: Normocephalic.  Eyes: EOM are normal.  Neck: Normal range of motion.  Cardiovascular: Normal rate.   Respiratory: Effort normal.  GI: Soft. She exhibits no distension. There is no tenderness.  Genitourinary: Vagina normal and uterus normal. No vaginal discharge found.  Musculoskeletal: Normal range of motion.  Neurological: She is alert and oriented to person, place, and time.  Skin: Skin is warm and dry. No rash noted.  Psychiatric: She has a normal mood and affect. Her behavior is normal.    Prenatal labs: ABO, Rh: --/--/O POS (04/13 1610) Antibody: NEG (04/13 9604) Rubella: Immune (09/03 0000) RPR: Nonreactive (09/03 0000)  HBsAg: Negative (09/03 0000)  HIV: Non-reactive (09/03 0000)  GBS: Negative (03/28 0000)   Assessment/Plan: [id number] [id number] at 40.1wks presenting for IOL due to pt request w/ Type A1GDM. Feels well.  - Admit to L&D - Confirmed Vertex on manual exam - Start Pitocin - Fentanyl for pain control - Will breast feed - GBS neg - Unsure of contraception at this time - Anticipate SVD  MERRELL, DAVID, MD Family Medicine Resident PGY-2 01/11/2013, 9:11 AM   I saw and examined patient and agree with above resident note. I reviewed history, imaging, labs, and vitals. I  personally reviewed the fetal heart tracing, and it is reactive. Napoleon Form, MD

## 2013-01-12 LAB — GLUCOSE, CAPILLARY
Glucose-Capillary: 101 mg/dL — ABNORMAL HIGH (ref 70–99)
Glucose-Capillary: 104 mg/dL — ABNORMAL HIGH (ref 70–99)
Glucose-Capillary: 203 mg/dL — ABNORMAL HIGH (ref 70–99)

## 2013-01-12 LAB — CBC
Platelets: 145 10*3/uL — ABNORMAL LOW (ref 150–400)
RDW: 14.2 % (ref 11.5–15.5)
WBC: 11 10*3/uL — ABNORMAL HIGH (ref 4.0–10.5)

## 2013-01-12 NOTE — Progress Notes (Signed)
Seen also by me Agree with note  Wynelle Bourgeois CNM

## 2013-01-12 NOTE — Progress Notes (Signed)
Post Partum Day 1  Subjective: up ad lib, voiding, tolerating PO, + flatus and Vaginal pain that is relieved by Ibuprofen  Objective: Blood pressure 92/62, pulse 71, temperature 98 F (36.7 C), temperature source Oral, resp. rate 16, height 5' (1.524 m), weight 66.225 kg (146 lb), last menstrual period 03/19/2012, SpO2 97.00%, unknown if currently breastfeeding.  Physical Exam:  General: alert, cooperative and appears stated age 27: appropriate Uterine Fundus: firm  DVT Evaluation: No evidence of DVT seen on physical exam.   Recent Labs  01/11/13 0807 01/12/13 0645  HGB 11.0* 9.9*  HCT 33.2* 30.2*    Assessment/Plan: Plan for discharge tomorrow and Contraception Nexplanon - bottle feeding - Post prandial glucose x3 today (Pt is a Type A1DM who gave birth to an LGA infant)   LOS: 1 day   Remigio Mcmillon, MD Family Medicine Resident PGY2 01/12/2013, 7:39 AM

## 2013-01-12 NOTE — H&P (Signed)
Attestation of Attending Supervision of Advanced Practitioner: Evaluation and management procedures were performed by the PA/NP/CNM/OB Fellow under my supervision/collaboration. Chart reviewed and agree with management and plan.  Jennet Scroggin V 01/12/2013 5:00 PM

## 2013-01-12 NOTE — Progress Notes (Signed)
UR chart review completed.  

## 2013-01-13 MED ORDER — IBUPROFEN 600 MG PO TABS: 600.0000 mg | ORAL_TABLET | Freq: Four times a day (QID) | ORAL | Status: DC | PRN

## 2013-01-13 MED ORDER — DOCUSATE SODIUM 100 MG PO CAPS: 100.0000 mg | ORAL_CAPSULE | Freq: Two times a day (BID) | ORAL | Status: DC | PRN

## 2013-01-13 MED ORDER — FERROUS SULFATE 325 (65 FE) MG PO TABS: 325.0000 mg | ORAL_TABLET | Freq: Every day | ORAL | Status: DC

## 2013-01-13 NOTE — Discharge Summary (Signed)
Attestation of Attending Supervision of Advanced Practitioner (CNM/NP): Evaluation and management procedures were performed by the Advanced Practitioner under my supervision and collaboration.  I have reviewed the Advanced Practitioner's note and chart, and I agree with the management and plan.  HARRAWAY-SMITH, Katerra Ingman 11:34 AM

## 2013-01-13 NOTE — Discharge Summary (Signed)
Obstetric Discharge Summary Reason for Admission: induction of labor Prenatal Procedures: none Intrapartum Procedures: spontaneous vaginal delivery Postpartum Procedures: none Complications-Operative and Postpartum: 1st degree perineal laceration Hemoglobin  Date Value Range Status  01/12/2013 9.9* 12.0 - 15.0 g/dL Final     HCT  Date Value Range Status  01/12/2013 30.2* 36.0 - 46.0 % Final  Hospital Course: Admitted for IOL for A1DM.   Delivery Note  At 12:26 PM a viable female was delivered via Vaginal, Spontaneous Delivery (Presentation: Left Occiput Anterior, w/ L hand presentation by face). APGAR: 8 ,9 ; weight "pending" .  Placenta status: Intact, Spontaneous. Cord: 3 vessels with the following complications: Short.  Anesthesia: None  Episiotomy: none  Lacerations: 1st degree perineal  Suture Repair: 3.0 vicryl  Est. Blood Loss (mL): 200  Has done well postpartum. Ready for discharge.  Physical Exam:  General: alert, cooperative and no distress Lochia: appropriate Uterine Fundus: soft Incision: n/a DVT Evaluation: No evidence of DVT seen on physical exam. Negative Homan's sign. No cords or calf tenderness.  Discharge Diagnoses: Term Pregnancy-delivered, gestational diabetes A1  Discharge Information: Date: 01/13/2013 Activity: pelvic rest Diet: routine Medications: PNV, Ibuprofen, Colace and Iron Condition: stable Instructions: refer to practice specific booklet Discharge to: home   Newborn Data: Live born female  Birth Weight: 8 lb 8 oz (3856 g) APGAR: 8, 9  Home with mother.  Levert Feinstein 01/13/2013, 7:37 AM  Seen also by me Agree with note Wynelle Bourgeois CNM

## 2013-01-26 ENCOUNTER — Ambulatory Visit: Payer: Self-pay | Admitting: Obstetrics and Gynecology

## 2013-02-09 ENCOUNTER — Ambulatory Visit (INDEPENDENT_AMBULATORY_CARE_PROVIDER_SITE_OTHER): Payer: Medicaid Other | Admitting: Obstetrics and Gynecology

## 2013-02-09 ENCOUNTER — Encounter: Payer: Self-pay | Admitting: Obstetrics and Gynecology

## 2013-02-09 VITALS — BP 102/70 | Ht 60.0 in | Wt 128.6 lb

## 2013-02-09 DIAGNOSIS — Z3009 Encounter for other general counseling and advice on contraception: Secondary | ICD-10-CM

## 2013-02-09 NOTE — Progress Notes (Signed)
  Subjective:     Shamiyah Ngu is a 27 y.o. female who presents for a postpartum visit. She is 4 week postpartum following a spontaneous vaginal delivery. I have fully reviewed the prenatal and intrapartum course. The delivery was at 40 gestational weeks. Outcome: spontaneous vaginal delivery. Anesthesia: none. Postpartum course has been benign. Baby's course has been normal.. Baby is feeding by breast. Bleeding thin lochia. Bowel function is normal. Bladder function is normal. Patient is not sexually active. Contraception method is . Postpnartum depression screening: negative.  The following portions of the patient's history were reviewed and updated as appropriate: allergies, current medications, past family history, past medical history, past social history, past surgical history and problem list.  Review of Systems Pertinent items are noted in HPI.   Objective:    BP 102/70  Ht 5' (1.524 m)  Wt 128 lb 9.6 oz (58.333 kg)  BMI 25.12 kg/m2  Breastfeeding? No  General:  alert, appears stated age and delirious   Breasts:  inspection negative, no nipple discharge or bleeding, no masses or nodularity palpable  Lungs:   Heart:  S1, S2 normal  Abdomen: soft, non-tender; bowel sounds normal; no masses,  no organomegaly   Vulva:  normal  Vagina: normal vagina  Cervix:  multiparous appearance  Corpus: anteverted, mobile and well supported  Adnexa:  normal adnexa  Rectal Exam:         Assessment:     normal postpartum exam. Pap smear not done at today's visit.   Plan:    1. Contraception: Nexplanon 2. Return 1 week toinsert 3. Follow up in: 1 week or as needed.

## 2013-02-18 ENCOUNTER — Encounter: Payer: Self-pay | Admitting: Obstetrics & Gynecology

## 2013-02-18 ENCOUNTER — Ambulatory Visit (INDEPENDENT_AMBULATORY_CARE_PROVIDER_SITE_OTHER): Payer: Medicaid Other | Admitting: Obstetrics & Gynecology

## 2013-02-18 VITALS — BP 90/60 | Wt 125.0 lb

## 2013-02-18 DIAGNOSIS — Z3009 Encounter for other general counseling and advice on contraception: Secondary | ICD-10-CM

## 2013-02-18 DIAGNOSIS — Z3202 Encounter for pregnancy test, result negative: Secondary | ICD-10-CM

## 2013-03-09 ENCOUNTER — Encounter: Payer: Self-pay | Admitting: Obstetrics & Gynecology

## 2013-03-09 ENCOUNTER — Ambulatory Visit (INDEPENDENT_AMBULATORY_CARE_PROVIDER_SITE_OTHER): Payer: Medicaid Other | Admitting: Obstetrics & Gynecology

## 2013-03-09 VITALS — BP 100/70 | Wt 124.0 lb

## 2013-03-09 DIAGNOSIS — Z3202 Encounter for pregnancy test, result negative: Secondary | ICD-10-CM

## 2013-03-09 DIAGNOSIS — Z30017 Encounter for initial prescription of implantable subdermal contraceptive: Secondary | ICD-10-CM

## 2013-03-09 LAB — POCT URINE PREGNANCY: Preg Test, Ur: NEGATIVE

## 2013-03-09 NOTE — Progress Notes (Signed)
Patient ID: Cindy Thompson, female   DOB: 11/16/1985, 27 y.o.   MRN: [id number] Nexplanon system to be inserted.  No sex  Pregnancy test negative  Inserted in left arm without difficulty  Palpable  Follow up prn yearly

## 2013-11-30 ENCOUNTER — Encounter: Payer: Self-pay | Admitting: Nurse Practitioner

## 2013-11-30 ENCOUNTER — Ambulatory Visit (INDEPENDENT_AMBULATORY_CARE_PROVIDER_SITE_OTHER): Payer: BC Managed Care – PPO | Admitting: Nurse Practitioner

## 2013-11-30 VITALS — BP 100/70 | HR 68 | Temp 98.3°F | Ht 59.5 in | Wt 119.5 lb

## 2013-11-30 DIAGNOSIS — S43499A Other sprain of unspecified shoulder joint, initial encounter: Secondary | ICD-10-CM

## 2013-11-30 DIAGNOSIS — S46819A Strain of other muscles, fascia and tendons at shoulder and upper arm level, unspecified arm, initial encounter: Secondary | ICD-10-CM | POA: Insufficient documentation

## 2013-11-30 NOTE — Progress Notes (Signed)
Pre visit review using our clinic review tool, if applicable. No additional management support is needed unless otherwise documented below in the visit note. 

## 2013-11-30 NOTE — Patient Instructions (Signed)
You have muscle strain from working long hours. You need to stretch muscles throughout your work day-about every hour. You may use moist heat on shoulders & neck several times daily (fill sock with dry rice , tie knot in sock so rice does not fall out, place in microwave for 1-2 minutes). You may take 200 to 400 mg ibuprophen with food twice daily for pain. Nice to meet you! Let us know if you are not feeling better.  Muscle Strain A muscle strain is an injury that occurs when a muscle is stretched beyond its normal length. Usually a small number of muscle fibers are torn when this happens. Muscle strain is rated in degrees. First-degree strains have the least amount of muscle fiber tearing and pain. Second-degree and third-degree strains have increasingly more tearing and pain.  Usually, recovery from muscle strain takes 1 2 weeks. Complete healing takes 5 6 weeks.  CAUSES  Muscle strain happens when a sudden, violent force placed on a muscle stretches it too far. This may occur with lifting, sports, or a fall.  RISK FACTORS Muscle strain is especially common in athletes.  SIGNS AND SYMPTOMS At the site of the muscle strain, there may be:  Pain.  Bruising.  Swelling.  Difficulty using the muscle due to pain or lack of normal function. DIAGNOSIS  Your health care provider will perform a physical exam and ask about your medical history. TREATMENT  Often, the best treatment for a muscle strain is resting, icing, and applying cold compresses to the injured area.  HOME CARE INSTRUCTIONS   Use the PRICE method of treatment to promote muscle healing during the first 2 3 days after your injury. The PRICE method involves:  Protecting the muscle from being injured again.  Restricting your activity and resting the injured body part.  Icing your injury. To do this, put ice in a plastic bag. Place a towel between your skin and the bag. Then, apply the ice and leave it on from 15 20 minutes each  hour. After the third day, switch to moist heat packs.  Apply compression to the injured area with a splint or elastic bandage. Be careful not to wrap it too tightly. This may interfere with blood circulation or increase swelling.  Elevate the injured body part above the level of your heart as often as you can.  Only take over-the-counter or prescription medicines for pain, discomfort, or fever as directed by your health care provider.  Warming up prior to exercise helps to prevent future muscle strains. SEEK MEDICAL CARE IF:   You have increasing pain or swelling in the injured area.  You have numbness, tingling, or a significant loss of strength in the injured area. MAKE SURE YOU:   Understand these instructions.  Will watch your condition.  Will get help right away if you are not doing well or get worse. Document Released: 09/17/2005 Document Revised: 07/08/2013 Document Reviewed: 04/16/2013 [facility] Patient Information 2014 [place], [place].

## 2013-11-30 NOTE — Progress Notes (Signed)
   Subjective:    Patient ID: Cindy Thompson, female    DOB: 06-30-1986, 28 y.o.   MRN: [id number]  HPI Comments: Pt is here to establish care. She bilateral trapezius pain that radiates to back of head and is associated with nausea.  Shoulder Pain  The pain is present in the neck, left shoulder, right shoulder and back (pain is located along bilat traps). This is a new problem. The current episode started 1 to 4 weeks ago (2wa). There has been no history of extremity trauma. The problem occurs intermittently (Experiences pain when at work. Works long hours as Publishing copycook and cashier in [employer] The problem has been gradually worsening. The quality of the pain is described as aching. The pain is moderate. Associated symptoms include tingling (some R arm tingling, intermittent). Pertinent negatives include no fever, inability to bear weight, itching, joint locking, joint swelling, limited range of motion, numbness or stiffness. The symptoms are aggravated by activity. She has tried acetaminophen for the symptoms. The treatment provided no relief. Family history does not include gout or rheumatoid arthritis. There is no history of diabetes, gout, osteoarthritis or rheumatoid arthritis.      Review of Systems  Constitutional: Negative for fever, chills, activity change, appetite change and fatigue.  Musculoskeletal: Positive for back pain (upper back & neck), myalgias (along bilat traps) and neck pain. Negative for arthralgias, gout and stiffness.  Skin: Negative for itching.  Neurological: Positive for dizziness, tingling (some R arm tingling, intermittent) and headaches. Negative for weakness and numbness.       Objective:   Physical Exam  Vitals reviewed. Constitutional: She is oriented to person, place, and time. She appears well-developed and well-nourished. No distress.  HENT:  Head: Normocephalic and atraumatic.  Eyes: Conjunctivae are normal. Right eye exhibits no discharge. Left eye exhibits no  discharge.  Neck: Normal range of motion. Neck supple.  Cardiovascular: Normal rate.   Pulmonary/Chest: Effort normal.  Musculoskeletal: Normal range of motion. She exhibits tenderness (tender to palpation thoracic spine & adjacent to spine along traps). She exhibits no edema.  Neurological: She is alert and oriented to person, place, and time.  Skin: Skin is warm and dry.  Psychiatric: She has a normal mood and affect. Her behavior is normal. Thought content normal.          Assessment & Plan:   1. Trapezius muscle strain Bilat, asooc w/HA & nausea. Occurs with long work hours. Heat, stretches, NSAIDS. See pt instructions. Recmd CPE at convenience.

## 2013-12-14 ENCOUNTER — Encounter: Payer: BC Managed Care – PPO | Admitting: Nurse Practitioner

## 2013-12-21 ENCOUNTER — Ambulatory Visit (INDEPENDENT_AMBULATORY_CARE_PROVIDER_SITE_OTHER): Payer: BC Managed Care – PPO | Admitting: Nurse Practitioner

## 2013-12-21 ENCOUNTER — Encounter: Payer: Self-pay | Admitting: Nurse Practitioner

## 2013-12-21 VITALS — BP 95/63 | HR 61 | Temp 97.9°F | Ht 59.5 in | Wt 114.2 lb

## 2013-12-21 DIAGNOSIS — S46819A Strain of other muscles, fascia and tendons at shoulder and upper arm level, unspecified arm, initial encounter: Secondary | ICD-10-CM

## 2013-12-21 DIAGNOSIS — K5900 Constipation, unspecified: Secondary | ICD-10-CM

## 2013-12-21 DIAGNOSIS — E049 Nontoxic goiter, unspecified: Secondary | ICD-10-CM

## 2013-12-21 DIAGNOSIS — Z0000 Encounter for general adult medical examination without abnormal findings: Secondary | ICD-10-CM

## 2013-12-21 LAB — URINALYSIS, ROUTINE W REFLEX MICROSCOPIC
Bilirubin Urine: NEGATIVE
Hgb urine dipstick: NEGATIVE
Ketones, ur: 40 — AB
Nitrite: NEGATIVE
PH: 6 (ref 5.0–8.0)
SPECIFIC GRAVITY, URINE: 1.025 (ref 1.000–1.030)
TOTAL PROTEIN, URINE-UPE24: NEGATIVE
Urine Glucose: NEGATIVE
Urobilinogen, UA: 0.2 (ref 0.0–1.0)

## 2013-12-21 LAB — COMPREHENSIVE METABOLIC PANEL
ALBUMIN: 5 g/dL (ref 3.5–5.2)
ALK PHOS: 57 U/L (ref 39–117)
ALT: 28 U/L (ref 0–35)
AST: 22 U/L (ref 0–37)
BUN: 14 mg/dL (ref 6–23)
CALCIUM: 9.6 mg/dL (ref 8.4–10.5)
CHLORIDE: 104 meq/L (ref 96–112)
CO2: 29 mEq/L (ref 19–32)
Creatinine, Ser: 0.7 mg/dL (ref 0.4–1.2)
GFR: 99.58 mL/min (ref >60.00)
GLUCOSE: 68 mg/dL — AB (ref 70–99)
POTASSIUM: 4.1 meq/L (ref 3.5–5.1)
SODIUM: 139 meq/L (ref 135–145)
TOTAL PROTEIN: 7.8 g/dL (ref 6.0–8.3)
Total Bilirubin: 0.8 mg/dL (ref 0.3–1.2)

## 2013-12-21 LAB — CBC
HCT: 40 % (ref 36.0–46.0)
Hemoglobin: 13.5 g/dL (ref 12.0–15.0)
MCHC: 33.6 g/dL (ref 30.0–36.0)
MCV: 94.7 fl (ref 78.0–100.0)
PLATELETS: 193 10*3/uL (ref 150.0–400.0)
RBC: 4.23 Mil/uL (ref 3.87–5.11)
RDW: 11.8 % (ref 11.5–14.6)
WBC: 7.4 10*3/uL (ref 4.5–10.5)

## 2013-12-21 LAB — TSH: TSH: 0.47 u[IU]/mL (ref 0.35–5.50)

## 2013-12-21 LAB — LIPID PANEL
CHOL/HDL RATIO: 3
Cholesterol: 125 mg/dL (ref 0–200)
HDL: 44.8 mg/dL (ref >39.00)
LDL CALC: 68 mg/dL (ref 0–99)
TRIGLYCERIDES: 62 mg/dL (ref 0.0–149.0)
VLDL: 12.4 mg/dL (ref 0.0–40.0)

## 2013-12-21 LAB — T4, FREE: Free T4: 1.02 ng/dL (ref 0.60–1.60)

## 2013-12-21 NOTE — Assessment & Plan Note (Signed)
Bilateral fullness thyroid,  R lobe > left. TSH & free T4 today. US thyroid.

## 2013-12-21 NOTE — Patient Instructions (Addendum)
Let me know if you want to see gynecology for different birth control and I will refer you. Please call back with information about doctor in [place]. Eat pear everyday & drink 3 to 4 bottles water everyday to help with having bowel movement daily. Continue neck & shoulder exercises. Please get ultrasound of thyroid. Please schedule appointment in 1 to 2 weeks to discuss thyroid ultrasound & lab results & follow up on constipation. My office will call you to discuss lab results.    Leuprolide depot injection or implant What is this medicine? LEUPROLIDE (loo PROE lide) is a man-made protein that acts like a natural hormone in the body. It decreases testosterone in men and decreases estrogen in women. In men, this medicine is used to treat advanced prostate cancer. In women, some forms of this medicine may be used to treat endometriosis, uterine fibroids, or other female hormone-related problems. This medicine may be used for other purposes; ask your health care provider or pharmacist if you have questions. COMMON BRAND NAME(S): Eligard, Lupron Depot, Lupron Depot-Ped, Viadur What should I tell my health care provider before I take this medicine? They need to know if you have any of these conditions: -diabetes -heart disease or previous heart attack -high blood pressure -high cholesterol -osteoporosis -pain or difficulty passing urine -spinal cord metastasis -stroke -tobacco smoker -unusual vaginal bleeding (women) -an unusual or allergic reaction to leuprolide, benzyl alcohol, other medicines, foods, dyes, or preservatives -pregnant or trying to get pregnant -breast-feeding How should I use this medicine? This medicine is for injection into a muscle or for implant or injection under the skin. It is given by a health care professional in a hospital or clinic setting. The specific product will determine how it will be given to you. Make sure you understand which product you receive and how  often you will receive it. Talk to your pediatrician regarding the use of this medicine in children. Special care may be needed. Overdosage: If you think you have taken too much of this medicine contact a poison control center or emergency room at once. NOTE: This medicine is only for you. Do not share this medicine with others. What if I miss a dose? It is important not to miss a dose. Call your doctor or health care professional if you are unable to keep an appointment. Depot injections: Depot injections are given either once-monthly, every 12 weeks, every 16 weeks, or every 24 weeks depending on the product you are prescribed. The product you are prescribed will be based on if you are female or female, and your condition. Make sure you understand your product and dosing. Implant dosing: The implant is removed and replaced once a year. The implant is only used in males. What may interact with this medicine? Do not take this medicine with any of the following medications: -chasteberry This medicine may also interact with the following medications: -herbal or dietary supplements, like black cohosh or DHEA -female hormones, like estrogens or progestins and birth control pills, patches, rings, or injections -female hormones, like testosterone This list may not describe all possible interactions. Give your health care provider a list of all the medicines, herbs, non-prescription drugs, or dietary supplements you use. Also tell them if you smoke, drink alcohol, or use illegal drugs. Some items may interact with your medicine. What should I watch for while using this medicine? Visit your doctor or health care professional for regular checks on your progress. During the first weeks of treatment,  your symptoms may get worse, but then will improve as you continue your treatment. You may get hot flashes, increased bone pain, increased difficulty passing urine, or an aggravation of nerve symptoms. Discuss these  effects with your doctor or health care professional, some of them may improve with continued use of this medicine. Female patients may experience a menstrual cycle or spotting during the first months of therapy with this medicine. If this continues, contact your doctor or health care professional. What side effects may I notice from receiving this medicine? Side effects that you should report to your doctor or health care professional as soon as possible: -allergic reactions like skin rash, itching or hives, swelling of the face, lips, or tongue -breathing problems -chest pain -depression or memory disorders -pain in your legs or groin -pain at site where injected or implanted -severe headache -swelling of the feet and legs -visual changes -vomiting Side effects that usually do not require medical attention (report to your doctor or health care professional if they continue or are bothersome): -breast swelling or tenderness -decrease in sex drive or performance -diarrhea -hot flashes -loss of appetite -muscle, joint, or bone pains -nausea -redness or irritation at site where injected or implanted -skin problems or acne This list may not describe all possible side effects. Call your doctor for medical advice about side effects. You may report side effects to FDA at 1-800-FDA-1088. Where should I keep my medicine? This drug is given in a hospital or clinic and will not be stored at home. NOTE: This sheet is a summary. It may not cover all possible information. If you have questions about this medicine, talk to your doctor, pharmacist, or health care provider.  2014, Elsevier/Gold Standard. (2010-03-21 14:41:21)   Preventive Care for Adults, Female A healthy lifestyle and preventive care can promote health and wellness. Preventive health guidelines for women include the following key practices.  A routine yearly physical is a good way to check with your health care provider about  your health and preventive screening. It is a chance to share any concerns and updates on your health and to receive a thorough exam.  Visit your dentist for a routine exam and preventive care every 6 months. Brush your teeth twice a day and floss once a day. Good oral hygiene prevents tooth decay and gum disease.  The frequency of eye exams is based on your age, health, family medical history, use of contact lenses, and other factors. Follow your health care provider's recommendations for frequency of eye exams.  Eat a healthy diet. Foods like vegetables, fruits, whole grains, low-fat dairy products, and lean protein foods contain the nutrients you need without too many calories. Decrease your intake of foods high in solid fats, added sugars, and salt. Eat the right amount of calories for you.Get information about a proper diet from your health care provider, if necessary.  Regular physical exercise is one of the most important things you can do for your health. Most adults should get at least 150 minutes of moderate-intensity exercise (any activity that increases your heart rate and causes you to sweat) each week. In addition, most adults need muscle-strengthening exercises on 2 or more days a week.  Maintain a healthy weight. The body mass index (BMI) is a screening tool to identify possible weight problems. It provides an estimate of body fat based on height and weight. Your health care provider can find your BMI, and can help you achieve or maintain a healthy weight.For  adults 20 years and older:  A BMI below 18.5 is considered underweight.  A BMI of 18.5 to 24.9 is normal.  A BMI of 25 to 29.9 is considered overweight.  A BMI of 30 and above is considered obese.  Maintain normal blood lipids and cholesterol levels by exercising and minimizing your intake of saturated fat. Eat a balanced diet with plenty of fruit and vegetables. Blood tests for lipids and cholesterol should begin at age 19  and be repeated every 5 years. If your lipid or cholesterol levels are high, you are over 50, or you are at high risk for heart disease, you may need your cholesterol levels checked more frequently.Ongoing high lipid and cholesterol levels should be treated with medicines if diet and exercise are not working.  If you smoke, find out from your health care provider how to quit. If you do not use tobacco, do not start.  Lung cancer screening is recommended for adults aged 109 80 years who are at high risk for developing lung cancer because of a history of smoking. A yearly low-dose CT scan of the lungs is recommended for people who have at least a 30-pack-year history of smoking and are a current smoker or have quit within the past 15 years. A pack year of smoking is smoking an average of 1 pack of cigarettes a day for 1 year (for example: 1 pack a day for 30 years or 2 packs a day for 15 years). Yearly screening should continue until the smoker has stopped smoking for at least 15 years. Yearly screening should be stopped for people who develop a health problem that would prevent them from having lung cancer treatment.  If you are pregnant, do not drink alcohol. If you are breastfeeding, be very cautious about drinking alcohol. If you are not pregnant and choose to drink alcohol, do not have more than 1 drink per day. One drink is considered to be 12 ounces (355 mL) of beer, 5 ounces (148 mL) of wine, or 1.5 ounces (44 mL) of liquor.  Avoid use of street drugs. Do not share needles with anyone. Ask for help if you need support or instructions about stopping the use of drugs.  High blood pressure causes heart disease and increases the risk of stroke. Your blood pressure should be checked at least every 1 to 2 years. Ongoing high blood pressure should be treated with medicines if weight loss and exercise do not work.  If you are 15 30 years old, ask your health care provider if you should take aspirin to  prevent strokes.  Diabetes screening involves taking a blood sample to check your fasting blood sugar level. This should be done once every 3 years, after age 42, if you are within normal weight and without risk factors for diabetes. Testing should be considered at a younger age or be carried out more frequently if you are overweight and have at least 1 risk factor for diabetes.  Breast cancer screening is essential preventive care for women. You should practice "breast self-awareness." This means understanding the normal appearance and feel of your breasts and may include breast self-examination. Any changes detected, no matter how small, should be reported to a health care provider. Women in their 60s and 30s should have a clinical breast exam (CBE) by a health care provider as part of a regular health exam every 1 to 3 years. After age 94, women should have a CBE every year. Starting at age 85,  women should consider having a mammogram (breast X-ray test) every year. Women who have a family history of breast cancer should talk to their health care provider about genetic screening. Women at a high risk of breast cancer should talk to their health care providers about having an MRI and a mammogram every year.  Breast cancer gene (BRCA)-related cancer risk assessment is recommended for women who have family members with BRCA-related cancers. BRCA-related cancers include breast, ovarian, tubal, and peritoneal cancers. Having family members with these cancers may be associated with an increased risk for harmful changes (mutations) in the breast cancer genes BRCA1 and BRCA2. Results of the assessment will determine the need for genetic counseling and BRCA1 and BRCA2 testing.  The Pap test is a screening test for cervical cancer. A Pap test can show cell changes on the cervix that might become cervical cancer if left untreated. A Pap test is a procedure in which cells are obtained and examined from the lower end  of the uterus (cervix).  Women should have a Pap test starting at age 25.  Between ages 67 and 68, Pap tests should be repeated every 2 years.  Beginning at age 90, you should have a Pap test every 3 years as long as the past 3 Pap tests have been normal.  Some women have medical problems that increase the chance of getting cervical cancer. Talk to your health care provider about these problems. It is especially important to talk to your health care provider if a new problem develops soon after your last Pap test. In these cases, your health care provider may recommend more frequent screening and Pap tests.  The above recommendations are the same for women who have or have not gotten the vaccine for human papillomavirus (HPV).  If you had a hysterectomy for a problem that was not cancer or a condition that could lead to cancer, then you no longer need Pap tests. Even if you no longer need a Pap test, a regular exam is a good idea to make sure no other problems are starting.  If you are between ages 59 and 27 years, and you have had normal Pap tests going back 10 years, you no longer need Pap tests. Even if you no longer need a Pap test, a regular exam is a good idea to make sure no other problems are starting.  If you have had past treatment for cervical cancer or a condition that could lead to cancer, you need Pap tests and screening for cancer for at least 20 years after your treatment.  If Pap tests have been discontinued, risk factors (such as a new sexual partner) need to be reassessed to determine if screening should be resumed.  The HPV test is an additional test that may be used for cervical cancer screening. The HPV test looks for the virus that can cause the cell changes on the cervix. The cells collected during the Pap test can be tested for HPV. The HPV test could be used to screen women aged 35 years and older, and should be used in women of any age who have unclear Pap test results.  After the age of 34, women should have HPV testing at the same frequency as a Pap test.  Colorectal cancer can be detected and often prevented. Most routine colorectal cancer screening begins at the age of 73 years and continues through age 37 years. However, your health care provider may recommend screening at an earlier age if  you have risk factors for colon cancer. On a yearly basis, your health care provider may provide home test kits to check for hidden blood in the stool. Use of a small camera at the end of a tube, to directly examine the colon (sigmoidoscopy or colonoscopy), can detect the earliest forms of colorectal cancer. Talk to your health care provider about this at age 79, when routine screening begins. Direct exam of the colon should be repeated every 5 10 years through age 44 years, unless early forms of pre-cancerous polyps or small growths are found.  People who are at an increased risk for hepatitis B should be screened for this virus. You are considered at high risk for hepatitis B if:  You were born in a country where hepatitis B occurs often. Talk with your health care provider about which countries are considered high risk.  Your parents were born in a high-risk country and you have not received a shot to protect against hepatitis B (hepatitis B vaccine).  You have HIV or AIDS.  You use needles to inject street drugs.  You live with, or have sex with, someone who has Hepatitis B.  You get hemodialysis treatment.  You take certain medicines for conditions like cancer, organ transplantation, and autoimmune conditions.  Hepatitis C blood testing is recommended for all people born from [birth year] through 1965 and any individual with known risks for hepatitis C.  Practice safe sex. Use condoms and avoid high-risk sexual practices to reduce the spread of sexually transmitted infections (STIs). STIs include gonorrhea, chlamydia, syphilis, trichomonas, herpes, HPV, and human  immunodeficiency virus (HIV). Herpes, HIV, and HPV are viral illnesses that have no cure. They can result in disability, cancer, and death. Sexually active women aged 33 years and younger should be checked for chlamydia. Older women with new or multiple partners should also be tested for chlamydia. Testing for other STIs is recommended if you are sexually active and at increased risk.  Osteoporosis is a disease in which the bones lose minerals and strength with aging. This can result in serious bone fractures or breaks. The risk of osteoporosis can be identified using a bone density scan. Women ages 29 years and over and women at risk for fractures or osteoporosis should discuss screening with their health care providers. Ask your health care provider whether you should take a calcium supplement or vitamin D to reduce the rate of osteoporosis.  Menopause can be associated with physical symptoms and risks. Hormone replacement therapy is available to decrease symptoms and risks. You should talk to your health care provider about whether hormone replacement therapy is right for you.  Use sunscreen. Apply sunscreen liberally and repeatedly throughout the day. You should seek shade when your shadow is shorter than you. Protect yourself by wearing long sleeves, pants, a wide-brimmed hat, and sunglasses year round, whenever you are outdoors.  Once a month, do a whole body skin exam, using a mirror to look at the skin on your back. Tell your health care provider of new moles, moles that have irregular borders, moles that are larger than a pencil eraser, or moles that have changed in shape or color.  Stay current with required vaccines (immunizations).  Influenza vaccine. All adults should be immunized every year.  Tetanus, diphtheria, and acellular pertussis (Td, Tdap) vaccine. Pregnant women should receive 1 dose of Tdap vaccine during each pregnancy. The dose should be obtained regardless of the length of  time since the last dose. Immunization is  preferred during the 27th 36th week of gestation. An adult who has not previously received Tdap or who does not know her vaccine status should receive 1 dose of Tdap. This initial dose should be followed by tetanus and diphtheria toxoids (Td) booster doses every 10 years. Adults with an unknown or incomplete history of completing a 3-dose immunization series with Td-containing vaccines should begin or complete a primary immunization series including a Tdap dose. Adults should receive a Td booster every 10 years.  Varicella vaccine. An adult without evidence of immunity to varicella should receive 2 doses or a second dose if she has previously received 1 dose. Pregnant females who do not have evidence of immunity should receive the first dose after pregnancy. This first dose should be obtained before leaving the health care facility. The second dose should be obtained 4 8 weeks after the first dose.  Human papillomavirus (HPV) vaccine. Females aged 62 26 years who have not received the vaccine previously should obtain the 3-dose series. The vaccine is not recommended for use in pregnant females. However, pregnancy testing is not needed before receiving a dose. If a female is found to be pregnant after receiving a dose, no treatment is needed. In that case, the remaining doses should be delayed until after the pregnancy. Immunization is recommended for any person with an immunocompromised condition through the age of 40 years if she did not get any or all doses earlier. During the 3-dose series, the second dose should be obtained 4 8 weeks after the first dose. The third dose should be obtained 24 weeks after the first dose and 16 weeks after the second dose.  Zoster vaccine. One dose is recommended for adults aged 72 years or older unless certain conditions are present.  Measles, mumps, and rubella (MMR) vaccine. Adults born before 50 generally are considered immune  to measles and mumps. Adults born in [birth year] or later should have 1 or more doses of MMR vaccine unless there is a contraindication to the vaccine or there is laboratory evidence of immunity to each of the three diseases. A routine second dose of MMR vaccine should be obtained at least 28 days after the first dose for students attending postsecondary schools, health care workers, or international travelers. People who received inactivated measles vaccine or an unknown type of measles vaccine during 1963 1967 should receive 2 doses of MMR vaccine. People who received inactivated mumps vaccine or an unknown type of mumps vaccine before 1979 and are at high risk for mumps infection should consider immunization with 2 doses of MMR vaccine. For females of childbearing age, rubella immunity should be determined. If there is no evidence of immunity, females who are not pregnant should be vaccinated. If there is no evidence of immunity, females who are pregnant should delay immunization until after pregnancy. Unvaccinated health care workers born before [birth year] who lack laboratory evidence of measles, mumps, or rubella immunity or laboratory confirmation of disease should consider measles and mumps immunization with 2 doses of MMR vaccine or rubella immunization with 1 dose of MMR vaccine.  Pneumococcal 13-valent conjugate (PCV13) vaccine. When indicated, a person who is uncertain of her immunization history and has no record of immunization should receive the PCV13 vaccine. An adult aged 31 years or older who has certain medical conditions and has not been previously immunized should receive 1 dose of PCV13 vaccine. This PCV13 should be followed with a dose of pneumococcal polysaccharide (PPSV23) vaccine. The PPSV23 vaccine dose should be  obtained at least 8 weeks after the dose of PCV13 vaccine. An adult aged 67 years or older who has certain medical conditions and previously received 1 or more doses of PPSV23 vaccine should  receive 1 dose of PCV13. The PCV13 vaccine dose should be obtained 1 or more years after the last PPSV23 vaccine dose.  Pneumococcal polysaccharide (PPSV23) vaccine. When PCV13 is also indicated, PCV13 should be obtained first. All adults aged 68 years and older should be immunized. An adult younger than age 65 years who has certain medical conditions should be immunized. Any person who resides in a nursing home or long-term care facility should be immunized. An adult smoker should be immunized. People with an immunocompromised condition and certain other conditions should receive both PCV13 and PPSV23 vaccines. People with human immunodeficiency virus (HIV) infection should be immunized as soon as possible after diagnosis. Immunization during chemotherapy or radiation therapy should be avoided. Routine use of PPSV23 vaccine is not recommended for American Indians, [place]atives, or people younger than 65 years unless there are medical conditions that require PPSV23 vaccine. When indicated, people who have unknown immunization and have no record of immunization should receive PPSV23 vaccine. One-time revaccination 5 years after the first dose of PPSV23 is recommended for people aged 54 64 years who have chronic kidney failure, nephrotic syndrome, asplenia, or immunocompromised conditions. People who received 1 2 doses of PPSV23 before age 44 years should receive another dose of PPSV23 vaccine at age 46 years or later if at least 5 years have passed since the previous dose. Doses of PPSV23 are not needed for people immunized with PPSV23 at or after age 72 years.  Meningococcal vaccine. Adults with asplenia or persistent complement component deficiencies should receive 2 doses of quadrivalent meningococcal conjugate (MenACWY-D) vaccine. The doses should be obtained at least 2 months apart. Microbiologists working with certain meningococcal bacteria, [employer] recruits, people at risk during an outbreak, and people  who travel to or live in countries with a high rate of meningitis should be immunized. A first-year college student up through age 8 years who is living in a residence hall should receive a dose if she did not receive a dose on or after her 16th birthday. Adults who have certain high-risk conditions should receive one or more doses of vaccine.  Hepatitis A vaccine. Adults who wish to be protected from this disease, have certain high-risk conditions, work with hepatitis A-infected animals, work in hepatitis A research labs, or travel to or work in countries with a high rate of hepatitis A should be immunized. Adults who were previously unvaccinated and who anticipate close contact with an international adoptee during the first 60 days after arrival in the [place] from a country with a high rate of hepatitis A should be immunized.  Hepatitis B vaccine. Adults who wish to be protected from this disease, have certain high-risk conditions, may be exposed to blood or other infectious body fluids, are household contacts or sex partners of hepatitis B positive people, are clients or workers in certain care facilities, or travel to or work in countries with a high rate of hepatitis B should be immunized.  Haemophilus influenzae type b (Hib) vaccine. A previously unvaccinated person with asplenia or sickle cell disease or having a scheduled splenectomy should receive 1 dose of Hib vaccine. Regardless of previous immunization, a recipient of a hematopoietic stem cell transplant should receive a 3-dose series 6 12 months after her successful transplant. Hib vaccine is not  recommended for adults with HIV infection. Preventive Services / Frequency Ages 66 to 39years  Blood pressure check.** / Every 1 to 2 years.  Lipid and cholesterol check.** / Every 5 years beginning at age 45.  Clinical breast exam.** / Every 3 years for women in their 75s and 15s.  BRCA-related cancer risk assessment.** / For women who  have family members with a BRCA-related cancer (breast, ovarian, tubal, or peritoneal cancers).  Pap test.** / Every 2 years from ages 23 through 1. Every 3 years starting at age 6 through age 34 or 63 with a history of 3 consecutive normal Pap tests.  HPV screening.** / Every 3 years from ages 22 through ages 59 to 26 with a history of 3 consecutive normal Pap tests.  Hepatitis C blood test.** / For any individual with known risks for hepatitis C.  Skin self-exam. / Monthly.  Influenza vaccine. / Every year.  Tetanus, diphtheria, and acellular pertussis (Tdap, Td) vaccine.** / Consult your health care provider. Pregnant women should receive 1 dose of Tdap vaccine during each pregnancy. 1 dose of Td every 10 years.  Varicella vaccine.** / Consult your health care provider. Pregnant females who do not have evidence of immunity should receive the first dose after pregnancy.  HPV vaccine. / 3 doses over 6 months, if 35 and younger. The vaccine is not recommended for use in pregnant females. However, pregnancy testing is not needed before receiving a dose.  Measles, mumps, rubella (MMR) vaccine.** / You need at least 1 dose of MMR if you were born in 1957 or later. You may also need a 2nd dose. For females of childbearing age, rubella immunity should be determined. If there is no evidence of immunity, females who are not pregnant should be vaccinated. If there is no evidence of immunity, females who are pregnant should delay immunization until after pregnancy.  Pneumococcal 13-valent conjugate (PCV13) vaccine.** / Consult your health care provider.  Pneumococcal polysaccharide (PPSV23) vaccine.** / 1 to 2 doses if you smoke cigarettes or if you have certain conditions.  Meningococcal vaccine.** / 1 dose if you are age 98 to 20 years and a Market researcher living in a residence hall, or have one of several medical conditions, you need to get vaccinated against meningococcal disease.  You may also need additional booster doses.  Hepatitis A vaccine.** / Consult your health care provider.  Hepatitis B vaccine.** / Consult your health care provider.  Haemophilus influenzae type b (Hib) vaccine.** / Consult your health care provider. ** Family history and personal history of risk and conditions may change your health care provider's recommendations. Document Released: 11/13/2001 Document Revised: 07/08/2013 Document Reviewed: 02/12/2011 [facility] Patient Information 2014 [place], [place].

## 2013-12-21 NOTE — Assessment & Plan Note (Signed)
Screening labs today: Vit D, CBC, CMET, TSH, UA, lipids Pt not sure when last PAP was. Had baby 1 ya. Will plan PAP if due , after reviewing records.  Vaccines UTD. BMI healthy.

## 2013-12-21 NOTE — Progress Notes (Signed)
Pre visit review using our clinic review tool, if applicable. No additional management support is needed unless otherwise documented below in the visit note. 

## 2013-12-21 NOTE — Progress Notes (Signed)
Subjective:     Cindy Thompson is a 28 y.o. female and is here for a comprehensive physical exam. The patient reports problems - constipation, occasional low abdominal pain that feels like menstrual cramps, but not associated with menstrual bleeding, Hx thyroid nodules.  She is using Depot implant-implanted about 1 year ago. MC not regular. Sometimes has mentrual cramps, not associated with bleeding. Pain not severe enough to take OTC meds. Works through pain & forgets about it "too busy". Re constipation, she has always had BM q 2-3 days since childhood. She had worsened constipation w/pregnancies and hemorrhoids.  She denies symptoms of thyroid disease such as unintentional weight changes, temp intolerance, palpitations. Constipation may be related, if hypothyroid.  History   Social History  . Marital Status: Married    Spouse Name: N/A    Number of Children: 3  . Years of Education: N/A   Occupational History  . restaurant owner    Social History Main Topics  . Smoking status: Never Smoker   . Smokeless tobacco: Never Used  . Alcohol Use: Yes     Comment: beer on holidays. not when preg.  . Drug Use: No  . Sexual Activity: Yes    Birth Control/ Protection: Implant   Other Topics Concern  . 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 She lives in [place]. Her mother helps her with children.    Health Maintenance  Topic Date Due  . Pap Smear  02/09/2002  . Influenza Vaccine  05/01/2013  . Tetanus/tdap  01/14/2023    The following portions of the patient's history were reviewed and updated as appropriate: allergies, current medications, past family history, past medical history, past social history, past surgical history and problem list.  Review of Systems Constitutional: negative for chills, fatigue, fevers and night sweats Eyes: negative, reports Hx of "near sightedness. No longer wears glasses. Ears, nose, mouth,  throat, and face: negative Respiratory: negative for cough Cardiovascular: negative for chest pressure/discomfort, lower extremity edema and palpitations Gastrointestinal: positive for constipation Genitourinary:positive for abnormal menstrual periods and irregular MC since implanon placed 1 ya.  Integument/breast: negative for rash Musculoskeletal:negative Behavioral/Psych: negative for excessive alcohol consumption, illegal drug usage, sleep disturbance and tobacco use Endocrine: negative, gives Hx thyroid nodules, no f/u in 4 yrs.   Objective:    BP 95/63  Pulse 61  Temp(Src) 97.9 F (36.6 C) (Temporal)  Ht 4' 11.5" (1.511 m)  Wt 114 lb 4 oz (51.823 kg)  BMI 22.70 kg/m2  SpO2 99% General appearance: alert, cooperative, appears stated age, no distress and accompanies by translator Head: Normocephalic, without obvious abnormality, atraumatic Eyes: conjunctivae/corneas clear. PERRL, EOM's intact. Fundi benign. Ears: normal TM's and external ear canals both ears Throat: normal findings: lips normal without lesions, buccal mucosa normal, soft palate, uvula, and tonsils normal and oropharynx pink & moist without lesions or evidence of thrush and abnormal findings: dark discoloration of teeth at gumline Neck: no adenopathy, no carotid bruit, supple, symmetrical, trachea midline and thyroid: enlarged Back: no kyphosis present Lungs: clear to auscultation bilaterally Heart: regular rate and rhythm, S1, S2 normal, no murmur, click, rub or gallop Abdomen: soft, non-tender; bowel sounds normal; no masses,  no organomegaly and LLQ fullness, likely stool Extremities: extremities normal, atraumatic, no cyanosis or edema Pulses: 2+ and symmetric Skin: Skin color, texture, turgor normal. No rashes or lesions Lymph nodes: no cervical, auricular, or supraclavicular LAD Neurologic:  Grossly normal    Assessment & Plan:   1. Preventative health care Need to review gyn/ob records to determine last  pap   2. Enlarged thyroid  3. Unspecified constipation  4. Trapezius muscle strain See prob list for complete A&P F/u 2 weeks: discuss thyroid results, labs, re-eval constipation, possibly pelvic exam.       See After Visit Summary for Counseling Recommendations

## 2013-12-21 NOTE — Assessment & Plan Note (Signed)
Likely due to repetitive movement, long work day. Improved with neck & shoulder stretches. Continue several times daily.

## 2013-12-21 NOTE — Assessment & Plan Note (Signed)
Having BM q2-3 days. Had hemorrhoids when pregnant. No visible blood in toilet or on stool or tissue paper.  Increase water intake to 3-4 16 oz bottles water daily. Eat pear daily. Use leaf lettuce & spinach in salads rather than ice berg.

## 2013-12-23 ENCOUNTER — Telehealth: Payer: Self-pay | Admitting: Nurse Practitioner

## 2013-12-23 NOTE — Telephone Encounter (Signed)
Thank you :)

## 2013-12-23 NOTE — Telephone Encounter (Signed)
FYI-The provider that the patient in [place] for her thyroid studies was Dr. Fransico SettersZoe Liu [phone]. Layne-I will request these records for you.

## 2013-12-24 LAB — HIV ANTIBODY (ROUTINE TESTING W REFLEX): HIV: NONREACTIVE

## 2013-12-25 LAB — VITAMIN D 25 HYDROXY (VIT D DEFICIENCY, FRACTURES): Vit D, 25-Hydroxy: 34 ng/mL (ref 30–89)

## 2013-12-28 ENCOUNTER — Ambulatory Visit (HOSPITAL_BASED_OUTPATIENT_CLINIC_OR_DEPARTMENT_OTHER)
Admission: RE | Admit: 2013-12-28 | Discharge: 2013-12-28 | Disposition: A | Discharge status: Home / Self Care | Payer: BC Managed Care – PPO | Source: Ambulatory Visit | Attending: Nurse Practitioner | Admitting: Nurse Practitioner

## 2013-12-28 DIAGNOSIS — E049 Nontoxic goiter, unspecified: Secondary | ICD-10-CM | POA: Insufficient documentation

## 2014-01-11 ENCOUNTER — Ambulatory Visit: Payer: BC Managed Care – PPO | Admitting: Nurse Practitioner

## 2014-01-13 ENCOUNTER — Encounter: Payer: BC Managed Care – PPO | Admitting: Advanced Practice Midwife

## 2014-01-14 ENCOUNTER — Other Ambulatory Visit (HOSPITAL_COMMUNITY)
Admission: RE | Admit: 2014-01-14 | Discharge: 2014-01-14 | Disposition: A | Discharge status: Home / Self Care | Payer: BC Managed Care – PPO | Source: Ambulatory Visit | Attending: Obstetrics and Gynecology | Admitting: Obstetrics and Gynecology

## 2014-01-14 ENCOUNTER — Ambulatory Visit (INDEPENDENT_AMBULATORY_CARE_PROVIDER_SITE_OTHER): Payer: BC Managed Care – PPO | Admitting: Obstetrics and Gynecology

## 2014-01-14 ENCOUNTER — Encounter: Payer: Self-pay | Admitting: Obstetrics and Gynecology

## 2014-01-14 VITALS — BP 102/70 | Ht 60.0 in | Wt 109.2 lb

## 2014-01-14 DIAGNOSIS — Z3046 Encounter for surveillance of implantable subdermal contraceptive: Secondary | ICD-10-CM

## 2014-01-14 DIAGNOSIS — Z30430 Encounter for insertion of intrauterine contraceptive device: Secondary | ICD-10-CM

## 2014-01-14 DIAGNOSIS — Z01419 Encounter for gynecological examination (general) (routine) without abnormal findings: Secondary | ICD-10-CM

## 2014-01-14 DIAGNOSIS — Z3202 Encounter for pregnancy test, result negative: Secondary | ICD-10-CM

## 2014-01-14 LAB — POCT URINE PREGNANCY: Preg Test, Ur: NEGATIVE

## 2014-01-14 NOTE — Progress Notes (Signed)
    GYNECOLOGY CLINIC PROCEDURE NOTE  Cindy Thompson is a 28 y.o. [phone] here for Mirena IUD insertion and Implanon removal. No GYN concerns.  Pap smear completed today.   IUD Insertion Procedure Note Patient identified, informed consent performed.  Discussed risks of irregular bleeding, cramping, infection, malpositioning or misplacement of the IUD outside the uterus which may require further procedure such as laparoscopy. Time out was performed.  Urine pregnancy test negative.  Speculum placed in the vagina.  Cervix visualized.  Cleaned with Betadine x 2.  Grasped anteriorly with a single tooth tenaculum.  Uterus sounded to 7 cm.  Mirena IUD placed per manufacturer's recommendations.  Strings trimmed to 3 cm. Tenaculum was removed, good hemostasis noted.  Patient tolerated procedure well.   Patient was given post-procedure instructions.  She was advised to be have backup contraception for one week.  Patient was also asked to check IUD strings periodically and follow up in 4 weeks for IUD check.  Implanon Removal:  Patient given informed consent for removal of her Implanon, time out was performed.  Signed copy in the chart.  Appropriate time out taken. Implanon site identified.  Area prepped in usual sterile fashon. One cc of 1% lidocaine was used to anesthetize the area at the distal end of the implant. A small stab incision was made right beside the implant on the distal portion.  The implanon rod was grasped using hemostats and removed without difficulty.  There was less than 3 cc blood loss. There were no complications.  A small amount of antibiotic ointment and steri-strips were applied over the small incision.  A pressure bandage was applied to reduce any bruising.  The patient tolerated the procedure well and was given post procedure instructions.  Cindy BurrowJohn V. Emanuelle Bastos MD [facility] 12:07 PM 01/14/14

## 2014-01-14 NOTE — Patient Instructions (Signed)
Intrauterine Device Insertion, Care After  Refer to this sheet in the next few weeks. These instructions provide you with information on caring for yourself after your procedure. Your health care provider may also give you more specific instructions. Your treatment has been planned according to current medical practices, but problems sometimes occur. Call your health care provider if you have any problems or questions after your procedure.  WHAT TO EXPECT AFTER THE PROCEDURE  Insertion of the IUD may cause some discomfort, such as cramping. The cramping should improve after the IUD is in place. You may have bleeding after the procedure. This is normal. It varies from light spotting for a few days to menstrual-like bleeding. When the IUD is in place, a string will extend past the cervix into the vagina for 1 2 inches. The strings should not bother you or your partner. If they do, talk to your health care provider.   HOME CARE INSTRUCTIONS    Check your intrauterine device (IUD) to make sure it is in place before you resume sexual activity. You should be able to feel the strings. If you cannot feel the strings, something may be wrong. The IUD may have fallen out of the uterus, or the uterus may have been punctured (perforated) during placement. Also, if the strings are getting longer, it may mean that the IUD is being forced out of the uterus. You no longer have full protection from pregnancy if any of these problems occur.   You may resume sexual intercourse if you are not having problems with the IUD. The copper IUD is considered immediately effective, and the hormone IUD works right away if inserted within 7 days of your period starting. You will need to use a backup method of birth control for 7 days if the IUD in inserted at any other time in your cycle.   Continue to check that the IUD is still in place by feeling for the strings after every menstrual period.   You may need to take pain medicine such as  acetaminophen or ibuprofen. Only take medicines as directed by your health care provider.  SEEK MEDICAL CARE IF:    You have bleeding that is heavier or lasts longer than a normal menstrual cycle.   You have a fever.   You have increasing cramps or abdominal pain not relieved with medicine.   You have abdominal pain that does not seem to be related to the same area of earlier cramping and pain.   You are lightheaded, unusually weak, or faint.   You have abnormal vaginal discharge or smells.   You have pain during sexual intercourse.   You cannot feel the IUD strings, or the IUD string has gotten longer.   You feel the IUD at the opening of the cervix in the vagina.   You think you are pregnant, or you miss your menstrual period.   The IUD string is hurting your sex partner.  MAKE SURE YOU:   Understand these instructions.   Will watch your condition.   Will get help right away if you are not doing well or get worse.  Document Released: 05/16/2011 Document Revised: 07/08/2013 Document Reviewed: 03/08/2013  [employer] Patient Information 2014 [employer].

## 2014-01-18 ENCOUNTER — Ambulatory Visit (INDEPENDENT_AMBULATORY_CARE_PROVIDER_SITE_OTHER): Payer: BC Managed Care – PPO | Admitting: Nurse Practitioner

## 2014-01-18 ENCOUNTER — Encounter: Payer: Self-pay | Admitting: Nurse Practitioner

## 2014-01-18 VITALS — BP 112/77 | HR 67 | Temp 97.6°F | Resp 18 | Ht 59.5 in | Wt 109.0 lb

## 2014-01-18 DIAGNOSIS — E041 Nontoxic single thyroid nodule: Secondary | ICD-10-CM

## 2014-01-18 DIAGNOSIS — E049 Nontoxic goiter, unspecified: Secondary | ICD-10-CM

## 2014-01-18 DIAGNOSIS — K5900 Constipation, unspecified: Secondary | ICD-10-CM

## 2014-01-18 NOTE — Progress Notes (Signed)
Subjective:     Cindy Thompson is a 28 y.o. female and is here for follow up of thyroid nodule and constipation. Patient has HX of thyroid nodule. She had US neck and biopsy 4 years ago in [place]. I do not have access to those records. Discussed plans to monitor nodule by yearly US. Will request records-try to get biopsy report. Pt reports no symptoms of hypo or hyperthyroidism. Last TSH & free T4 were normal. Constipation is better since changing birth control. Depo removed, mirena placed. Having bm q2d.  History   Social History  . Marital Status: Married    Spouse Name: N/A    Number of Children: 3  . Years of Education: N/A   Occupational History  . restaurant owner    Social History Main Topics  . Smoking status: Never Smoker   . Smokeless tobacco: Never Used  . Alcohol Use: Yes     Comment: beer on holidays. not when preg.  . Drug Use: No  . Sexual Activity: Yes    Birth Control/ Protection: IUD   Other Topics Concern  . 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 She lives in [place]. Her mother helps her with children.    Health Maintenance  Topic Date Due  . Pap Smear  02/09/2002  . Influenza Vaccine  05/01/2014  . Tetanus/tdap  01/14/2023    The following portions of the patient's history were reviewed and updated as appropriate: allergies, current medications, past medical history, past surgical history and problem list.  Review of Systems Pertinent items are noted in HPI.   Objective:    BP 112/77  Pulse 67  Temp(Src) 97.6 F (36.4 C) (Oral)  Resp 18  Ht 4' 11.5" (1.511 m)  Wt 109 lb (49.442 kg)  BMI 21.66 kg/m2  SpO2 97% General appearance: alert, cooperative, appears stated age and no distress Head: Normocephalic, without obvious abnormality, atraumatic Eyes: negative findings: lids and lashes normal and conjunctivae and sclerae normal Lungs: clear to auscultation bilaterally Heart: regular  rate and rhythm, S1, S2 normal, no murmur, click, rub or gallop   Neck: thyroid feels full, not able to palpate nodule Assessment:    thyroid nodule, R. Confirmed on US  Plan:    request US & biopsy report from historical provider to plan surveillance. See After Visit Summary for Counseling Recommendations

## 2014-01-18 NOTE — Patient Instructions (Addendum)
We will request ultrasound results and biopsy results of thyroid. My office will call with surveillance plan. Nice to see you!  Goiter Goiter is an enlarged thyroid gland. The thyroid gland sits at the base of the front of the neck. The gland produces hormones that regulate mood, body temperature, pulse rate, and digestion. Most goiters are painless and are not a cause for serious concern. Goiters and conditions that cause goiters can be treated if necessary.  CAUSES  Common causes of goiter include:  Graves disease (causes too much hormone to be produced [hyperthyroidism]).  Hashimoto's disease (causes too little hormone to be produced [hypothyroidism]).  Thyroiditis (inflammation of the thyroid sometimes caused by virus or pregnancy).  Nodular goiter (small bumps form; sometimes called toxic nodular goiter).  Pregnancy.  Thyroid cancer (very few goiters with nodules are cancerous).  Certain medications.  Radiation exposure.  Iodine deficiency (more common in developing countries in inland populations). RISK FACTORS Risk factors for goiter include:  A family history of goiter.  Female gender.  Inadequate iodine in the diet.  Age older than 40 years. SYMPTOMS  Many goiters do not cause symptoms. When symptoms do occur, they may include:  Swelling in the lower part of the neck. This swelling can range from a very small bump to a large lump.  A tight feeling in the throat.  A hoarse voice. Less commonly, a goiter may result in:  Coughing.  Wheezing.  Difficulty swallowing.  Difficulty breathing.  Bulging neck veins.  Dizziness. When a goiter is the result of hyperthyroidism, symptoms may include:  Rapid or irregular heart beat.  Sicknessin your stomach (nausea).  Vomiting.  Diarrhea.  Shaking.  Irritable feeling.  Bulging eyes.  Weight loss.  Heat sensitivity.  Anxiety. When a goiter is the result of hypothyroidism, symptoms may  include:  Tiredness.  Dry skin.  Constipation.  Weight gain.  Irregular menstrual cycle.  Depressed mood.  Sensitivity to cold. DIAGNOSIS  Tests used to diagnose goiter include:  A physical exam.  Blood tests, including thyroid hormone levels and antibody testing.  Ultrasonography, computerized X-ray scan (computed tomography, CT) or computerized magnetic scan (magnetic resonance imaging, MRI).  Thyroid scan (imaging along with safe radioactive injection).  Tissue sample taken (biopsy) of nodules. This is sometimes done to confirm that the nodules are not cancerous. TREATMENT  Treatment will depend on the cause of the goiter. Treatment may include:  Monitoring. In some cases, no treatment is necessary, and your doctor will monitor yourcondition at regular check ups.  Medications and supplements. Thyroid medication (thyroid hormone replacement) is available for hyperthroidism and hypothyroidism.  If inflammation is the cause, over-the-counter medication or steroid medication may be recommended.  Goiters caused by iodine deficiency can be treated with iodine supplements or changes in diet.  Radioactive iodine treatment. Radioactive iodine is injected into the blood. It travels to the thyroid gland, kills thyroid cells, and reduces the size of the gland. This is only used when the thyroid gland is overactive. Lifelong thyroid hormone medication is often necessary after this treatment.  Surgery. A procedure to remove all or part of the gland may be recommended in severe cases or when cancer is the cause. Hormones can be taken to replace the hormones normally produced by the thyroid. HOME CARE INSTRUCTIONS   Take medications as directed.  Follow your caregiver's recommendations for any dietary changes.  Follow up with your caregiver for further examination and testing, as directed. PREVENTION   If you have a  family history of goiter, discuss screening with your  doctor.  Make sure you are getting enough iodine in your diet.  Use of iodized table salt can help prevent iodine deficiency. Document Released: 03/07/2010 Document Revised: 12/10/2011 Document Reviewed: 03/07/2010 [facility] Patient Information 2014 [place]

## 2014-01-25 ENCOUNTER — Telehealth: Payer: Self-pay | Admitting: Obstetrics and Gynecology

## 2014-01-25 NOTE — Telephone Encounter (Signed)
Pt states got IUD 01/14/2014, now having light vaginal bleeding. Informed pt normal to have break through bleeding with recent IUD insertion to continue to monitor if worsens or severe cramping occurs call our office back. Pt verbalized understanding.

## 2014-02-11 ENCOUNTER — Encounter: Payer: Self-pay | Admitting: Obstetrics and Gynecology

## 2014-02-11 ENCOUNTER — Ambulatory Visit (INDEPENDENT_AMBULATORY_CARE_PROVIDER_SITE_OTHER): Payer: BC Managed Care – PPO | Admitting: Obstetrics and Gynecology

## 2014-02-11 VITALS — BP 100/60 | Ht 60.0 in | Wt 109.0 lb

## 2014-02-11 DIAGNOSIS — Z30431 Encounter for routine checking of intrauterine contraceptive device: Secondary | ICD-10-CM

## 2014-02-11 NOTE — Progress Notes (Signed)
This chart was scribed by Bennett Scrapehristina Taylor, Medical Scribe, for Dr. Christin BachJohn Reagen Haberman on on 02/11/14 at 12:17 PM. This chart was reviewed by Dr. Christin BachJohn Johnica Armwood for accuracy.  MRN: [id number] History:  28 y.o. Arrie SenateLei Towery [id number] here today for today for IUD string check; Mirena IUD was placed on 01/14/14. States that she had a normal menses this month, had cramping and normal amount of bleeding.   The following portions of the patient's history were reviewed and updated as appropriate: allergies, current medications, past family history, past medical history, past social history, past surgical history and problem list. Last pap smear on 01/14/14 was normal, negative HRHPV.  Review of Systems:  Pertinent items are noted in HPI.  Objective:  Physical Exam Blood pressure 100/60, height 5' (1.524 m), weight 109 lb (49.442 kg), not currently breastfeeding. Gen: NAD Abd: Soft, nontender and nondistended Pelvic: Normal appearing external genitalia; normal appearing vaginal mucosa and cervix.  IUD strings not visualized outside cervix. IUD visualized in good position within uterine fundus using transvaginal U/S.  Assessment & Plan:  Normal IUD check. Patient to keep IUD in place for five years; can come in for removal if she desires pregnancy within the next five years. Routine preventative health maintenance measures emphasized.

## 2014-03-16 ENCOUNTER — Telehealth: Payer: Self-pay | Admitting: Nurse Practitioner

## 2014-03-16 NOTE — Telephone Encounter (Signed)
US results mailed to patients home address.

## 2014-03-16 NOTE — Telephone Encounter (Signed)
Patient is requesting US results to be mailed to her.

## 2014-07-05 ENCOUNTER — Ambulatory Visit: Payer: BC Managed Care – PPO | Admitting: Nurse Practitioner

## 2014-07-12 ENCOUNTER — Ambulatory Visit: Payer: BC Managed Care – PPO | Admitting: Nurse Practitioner

## 2014-07-12 ENCOUNTER — Encounter: Payer: Self-pay | Admitting: Nurse Practitioner

## 2014-07-12 ENCOUNTER — Ambulatory Visit (INDEPENDENT_AMBULATORY_CARE_PROVIDER_SITE_OTHER): Payer: BC Managed Care – PPO | Admitting: Nurse Practitioner

## 2014-07-12 VITALS — BP 96/66 | HR 62 | Temp 98.0°F | Ht 59.5 in | Wt 106.0 lb

## 2014-07-12 DIAGNOSIS — Z23 Encounter for immunization: Secondary | ICD-10-CM

## 2014-07-12 DIAGNOSIS — K219 Gastro-esophageal reflux disease without esophagitis: Secondary | ICD-10-CM | POA: Insufficient documentation

## 2014-07-12 DIAGNOSIS — E041 Nontoxic single thyroid nodule: Secondary | ICD-10-CM

## 2014-07-12 LAB — H. PYLORI ANTIBODY, IGG: H Pylori IgG: NEGATIVE

## 2014-07-12 LAB — T4, FREE: Free T4: 1.05 ng/dL (ref 0.60–1.60)

## 2014-07-12 LAB — TSH: TSH: 2.73 u[IU]/mL (ref 0.35–4.50)

## 2014-07-12 MED ORDER — ESOMEPRAZOLE MAGNESIUM 40 MG PO CPDR: 40.0000 mg | DELAYED_RELEASE_CAPSULE | Freq: Every day | ORAL | Status: DC

## 2014-07-12 NOTE — Progress Notes (Signed)
Pre visit review using our clinic review tool, if applicable. No additional management support is needed unless otherwise documented below in the visit note. 

## 2014-07-12 NOTE — Progress Notes (Signed)
Subjective:     Cindy Thompson is a 28 y.o. female who presents for evaluation of abdominal pain in epigastric region. She is accompanied by interpreter. Pt speaks Congohinese. Onset was 3 years ago-she was diagnosed in [place] with "bacteria" & ulcer. She took medicine & got better. Symptoms returned a few mos ago & have been stable. The pain is described as burning, and is 4/10 in intensity. Pain is located in the epigastric region without radiation.  Aggravating factors: eating pickled chicken feet.  Alleviating factors: nexium. Associated symptoms: none. The patient denies anorexia, diarrhea and nausea. She reports BM every 2 days. She also c/o suprapubic pain, intermittent, started few weeks ago. States same pain she has w/ MC. She had mirena placed several  Mos ago. Advised pt to see gynecology if pain is persistent. She has Hx of thyroid nodules. Had neck US several mos ago. She has nodule R lobe, biopsy was recommended. Pt told me she had biopsy done in [place] 4 yrs ago. I do not have these records. Pt plans to go to [place] in few weeks. She will try to get records & bring to office. Pt understands biopsy is recommended. We agreed to discuss again in 2 mos at f/u.   The patient's history has been marked as reviewed and updated as appropriate.  Review of Systems Pertinent items are noted in HPI.     Objective:    BP 96/66  Pulse 62  Temp(Src) 98 F (36.7 C) (Temporal)  Ht 4' 11.5" (1.511 m)  Wt 106 lb (48.081 kg)  BMI 21.06 kg/m2  SpO2 100%  LMP 06/28/2014 General appearance: alert, cooperative, appears stated age and no distress Head: Normocephalic, without obvious abnormality, atraumatic Eyes: negative findings: lids and lashes normal and conjunctivae and sclerae normal Neck: no adenopathy, supple, symmetrical, trachea midline and thyroid: enlarged Lungs: clear to auscultation bilaterally Heart: regular rate and rhythm, S1, S2 normal, no murmur, click, rub or gallop Abdomen: soft, non-tender; bowel  sounds normal; no masses,  no organomegaly  Groin: No inguinal LAD    Assessment:  1. Gastroesophageal reflux disease, esophagitis presence not specified - esomeprazole (NEXIUM) 40 MG capsule; Take 1 capsule (40 mg total) by mouth daily.  Dispense: 30 capsule; Refill: 2 - H. pylori antibody, IgG  2. Need for prophylactic vaccination and inoculation against influenza - Flu Vaccine QUAD 36+ mos IM  3. Thyroid nodule - TSH - T4, free - Thyroid peroxidase antibody Need records of biopsy. I unable to attain, recommend pt have biopsy. Pt understands. Will discuss at f/u in 2 mos.

## 2014-07-12 NOTE — Patient Instructions (Signed)
Take omeprazole (Nexium) daily.  Cut back spicy food. Eat 2 to 3 spoons full yogurt before eating spicy food.  Start probiotic daily. You may take Florastor, Align, or Culterelle.  Take these medications daily until I see you again in 2 months.  If low abdominal pain continues, see Dr Emelda FearFerguson.

## 2014-07-13 ENCOUNTER — Telehealth: Payer: Self-pay | Admitting: Nurse Practitioner

## 2014-07-13 LAB — THYROID PEROXIDASE ANTIBODY

## 2014-07-13 NOTE — Telephone Encounter (Signed)
Left message for Cindy Thompson to return call.

## 2014-07-13 NOTE — Telephone Encounter (Signed)
pls call pt: [facility] Advise She does not have H. Pylori bacteria. Her husband does. She should not eat or drink after him. She should continue instructions given in office.

## 2014-07-13 NOTE — Telephone Encounter (Signed)
Alley returned call and was given results. 

## 2014-08-02 ENCOUNTER — Encounter: Payer: Self-pay | Admitting: Nurse Practitioner

## 2014-09-06 ENCOUNTER — Ambulatory Visit: Payer: BC Managed Care – PPO | Admitting: Nurse Practitioner

## 2015-02-11 ENCOUNTER — Ambulatory Visit (INDEPENDENT_AMBULATORY_CARE_PROVIDER_SITE_OTHER): Payer: 59 | Admitting: Nurse Practitioner

## 2015-02-11 ENCOUNTER — Other Ambulatory Visit: Payer: Self-pay

## 2015-02-11 ENCOUNTER — Encounter: Payer: Self-pay | Admitting: Nurse Practitioner

## 2015-02-11 VITALS — BP 94/64 | HR 55 | Temp 97.7°F | Ht 59.5 in | Wt 106.0 lb

## 2015-02-11 DIAGNOSIS — E041 Nontoxic single thyroid nodule: Secondary | ICD-10-CM | POA: Diagnosis not present

## 2015-02-11 DIAGNOSIS — L700 Acne vulgaris: Secondary | ICD-10-CM | POA: Diagnosis not present

## 2015-02-11 DIAGNOSIS — K219 Gastro-esophageal reflux disease without esophagitis: Secondary | ICD-10-CM

## 2015-02-11 DIAGNOSIS — E559 Vitamin D deficiency, unspecified: Secondary | ICD-10-CM

## 2015-02-11 LAB — TSH: TSH: 1.03 u[IU]/mL (ref 0.35–4.50)

## 2015-02-11 LAB — VITAMIN D 25 HYDROXY (VIT D DEFICIENCY, FRACTURES): VITD: 24.35 ng/mL — ABNORMAL LOW (ref 30.00–100.00)

## 2015-02-11 LAB — T4, FREE: FREE T4: 0.71 ng/dL (ref 0.60–1.60)

## 2015-02-11 MED ORDER — CLINDAMYCIN PHOSPHATE 1 % EX GEL: Freq: Two times a day (BID) | CUTANEOUS | Status: DC

## 2015-02-11 NOTE — Telephone Encounter (Signed)
error 

## 2015-02-11 NOTE — Patient Instructions (Signed)
  Start using antibiotic cream twice daily. Continue to use Proactiv products. Switch to gentle formula. If skin is still too dry, Use 1 & 3 twice daily. Use #2 once daily. Also buy sulphur mask "Skin purifying mask" & use twice weekly. Do not overscrub skin.  My office will call with lab results.  Thyroid and birth control can affect acne.

## 2015-02-11 NOTE — Progress Notes (Addendum)
Subjective:     Cindy Thompson is a 29 y.o. female presents w/c/o acne, f/u reflux, & vit d def. Pt is accompanied by interpreter.  acne: started 6 mos ago, getting worse. Using proactiv products. Drying skin, occasional face burns after using. Had mirena placed about 8 mos ago-could be contributing. Has thyroid nodules, euthyroid in past, will check TSH . Reflux: symptoms resolved w/taking nexium for 8 weeks. vit d def: not taking daily supplement.  The following portions of the patient's history were reviewed and updated as appropriate: allergies, current medications, past medical history, past social history, past surgical history and problem list.  Review of Systems Pertinent items are noted in HPI.    Objective:    BP 94/64 mmHg  Pulse 55  Temp(Src) 97.7 F (36.5 C) (Oral)  Ht 4' 11.5" (1.511 m)  Wt 106 lb (48.081 kg)  BMI 21.06 kg/m2  SpO2 100%  LMP 02/10/2015 BP 94/64 mmHg  Pulse 55  Temp(Src) 97.7 F (36.5 C) (Oral)  Ht 4' 11.5" (1.511 m)  Wt 106 lb (48.081 kg)  BMI 21.06 kg/m2  SpO2 100%  LMP 02/10/2015 General appearance: alert, cooperative, appears stated age and no distress Head: Normocephalic, without obvious abnormality, atraumatic Eyes: negative findings: lids and lashes normal and conjunctivae and sclerae normal Skin: multiple closed comedones bilat cheeks & jaw line. chest & shoulders clear. Neurologic: Grossly normal .   Assessment:     1. Acne vulgaris DD: mirena may be contributing, Hx thyroid nodules/euthyroid - clindamycin (CLINDAGEL) 1 % gel; Apply topically 2 (two) times daily.  Dispense: 30 g; Refill: 0 - T4, free - TSH  2. Vitamin D deficiency - Vit D  25 hydroxy (rtn osteoporosis monitoring)  3. Thyroid nodule - T4, free - TSH  4. Gastroesophageal reflux disease, esophagitis presence not specified symtpoms resolved.  F/u 3 weeks

## 2015-02-11 NOTE — Progress Notes (Signed)
Pre visit review using our clinic review tool, if applicable. No additional management support is needed unless otherwise documented below in the visit note. 

## 2015-02-15 ENCOUNTER — Telehealth: Payer: Self-pay | Admitting: Nurse Practitioner

## 2015-02-15 DIAGNOSIS — E559 Vitamin D deficiency, unspecified: Secondary | ICD-10-CM

## 2015-02-15 MED ORDER — VITAMIN D3 1.25 MG (50000 UT) PO CAPS: 1.0000 | ORAL_CAPSULE | ORAL | Status: DC

## 2015-02-15 NOTE — Telephone Encounter (Signed)
LMOVM of interpreter informing her of Jesyca's results. DPR Signed. They are to CB to make appointment or call with any questions or concerns.

## 2015-02-15 NOTE — Telephone Encounter (Signed)
pls call pt: Advise Vit d is low. She should tart prescription vitamin D. Take 1 capsule weekly for 12 weeks. Level will be checked again in 3 mos.pls call  Thyroid studies are nml. Continue w/instructions for skin care.

## 2015-03-04 ENCOUNTER — Encounter: Payer: Self-pay | Admitting: Nurse Practitioner

## 2015-03-04 ENCOUNTER — Ambulatory Visit (INDEPENDENT_AMBULATORY_CARE_PROVIDER_SITE_OTHER): Payer: 59 | Admitting: Nurse Practitioner

## 2015-03-04 VITALS — BP 94/62 | HR 58 | Temp 97.7°F | Ht 59.0 in | Wt 103.0 lb

## 2015-03-04 DIAGNOSIS — L700 Acne vulgaris: Secondary | ICD-10-CM | POA: Diagnosis not present

## 2015-03-04 DIAGNOSIS — E049 Nontoxic goiter, unspecified: Secondary | ICD-10-CM | POA: Diagnosis not present

## 2015-03-04 DIAGNOSIS — E559 Vitamin D deficiency, unspecified: Secondary | ICD-10-CM

## 2015-03-04 HISTORY — DX: Acne vulgaris: L70.0

## 2015-03-04 MED ORDER — CLINDAMYCIN-TRETINOIN 1.2-0.025 % EX GEL: CUTANEOUS | Status: DC

## 2015-03-04 NOTE — Patient Instructions (Signed)
Start using new cream. Stop using old cream  Keep hair clean and away from face & neck as it can make acne worse.  Continue to use proactiv products.  Drink water through out the day. Take vitamin D weekly for 12 weeks.  Return in 4 weeks.

## 2015-03-04 NOTE — Progress Notes (Signed)
Pre visit review using our clinic review tool, if applicable. No additional management support is needed unless otherwise documented below in the visit note. 

## 2015-03-06 DIAGNOSIS — E559 Vitamin D deficiency, unspecified: Secondary | ICD-10-CM | POA: Insufficient documentation

## 2015-03-06 NOTE — Assessment & Plan Note (Signed)
Monitor TSH q6 mos

## 2015-03-06 NOTE — Assessment & Plan Note (Signed)
Add retinoid/clindamycin combo

## 2015-03-06 NOTE — Progress Notes (Signed)
Subjective:     Cindy Thompson is a 29 y.o. female presents for f/u acne, goiter, vitamin D deficiency. She is accompanied by interpreter & young son.  Acne: some improvement w/ clindamycin cream, but acne persistent sides of face, jaw line & neck. Continues to use proactiv products: step 1&2 daily & sulphur mask 3/week.  less dryness since decreased use of benzoyl peroxide. Discussed adding retinoid combo cream. If no improvement, may have to consider mirena may be contributing. Goiter: R thyroiud nodule for several yrs. Had biopsy in [place]-pt reports no malignancy. F/u US performed 11/2013 FNA was recommended. Pt decline. TSH & free T4 nml. Continues to deny symptoms of hyper or hypothyroid. vitamin D deficiency: has not started prescription strength D. Discussed importance. Will start this week. Will check level when finishes script.  The following portions of the patient's history were reviewed and updated as appropriate: allergies, current medications, past medical history, past social history, past surgical history and problem list.The following portions of the patient's history were reviewed and updated as appropriate: allergies, current medications, past medical history, past social history, past surgical history and problem list.  Review of Systems Pertinent items are noted in HPI.    Objective:    BP 94/62 mmHg  Pulse 58  Temp(Src) 97.7 F (36.5 C) (Oral)  Ht 4\' 11"  (1.499 m)  Wt 103 lb (46.72 kg)  BMI 20.79 kg/m2  SpO2 100%  LMP 02/10/2015 BP 94/62 mmHg  Pulse 58  Temp(Src) 97.7 F (36.5 C) (Oral)  Ht 4\' 11"  (1.499 m)  Wt 103 lb (46.72 kg)  BMI 20.79 kg/m2  SpO2 100%  LMP 02/10/2015 General appearance: alert, cooperative, appears stated age and no distress Head: Normocephalic, without obvious abnormality, atraumatic Eyes: negative findings: lids and lashes normal and conjunctivae and sclerae normal Skin: acne sides of face & neck, no pustular or cystic lesions, few scars & pits &  post-inflam changes Lymph nodes: Cervical adenopathy: slightly enlarged R anterior LN near acne, moveable, 1 cm, tender Neurologic: Grossly normal    Assessment:Plan     1. Acne vulgaris D/c clindamycin start - clindamycin-tretinoin (ZIANA) gel; Apply pea-sized amount to entire face at bedtime.  Dispense: 30 g; Refill: 0 Continue proactiv products as discussed  2. Enlarged thyroid Continue to monitor US q5473yr, TSH q6 mos  3. Vitamin D deficiency Start precsription D Recheck level in 3 mos  F/u 4 wks

## 2015-03-07 ENCOUNTER — Other Ambulatory Visit: Payer: Self-pay | Admitting: Nurse Practitioner

## 2015-04-11 ENCOUNTER — Ambulatory Visit: Payer: 59 | Admitting: Nurse Practitioner

## 2015-04-13 ENCOUNTER — Ambulatory Visit: Payer: Self-pay | Admitting: Obstetrics and Gynecology

## 2015-04-19 ENCOUNTER — Ambulatory Visit: Payer: Self-pay | Admitting: Obstetrics and Gynecology

## 2015-04-19 ENCOUNTER — Encounter: Payer: Self-pay | Admitting: Obstetrics and Gynecology

## 2015-07-08 ENCOUNTER — Encounter: Payer: Self-pay | Admitting: Obstetrics and Gynecology

## 2015-07-08 ENCOUNTER — Ambulatory Visit (INDEPENDENT_AMBULATORY_CARE_PROVIDER_SITE_OTHER): Payer: 59 | Admitting: Obstetrics and Gynecology

## 2015-07-08 VITALS — BP 80/50 | HR 64 | Ht 60.0 in | Wt 99.8 lb

## 2015-07-08 DIAGNOSIS — N898 Other specified noninflammatory disorders of vagina: Secondary | ICD-10-CM | POA: Diagnosis not present

## 2015-07-08 DIAGNOSIS — K5901 Slow transit constipation: Secondary | ICD-10-CM

## 2015-07-08 NOTE — Addendum Note (Signed)
Addended by: Colen Darling on: 07/08/2015 02:04 PM   Modules accepted: Orders

## 2015-07-08 NOTE — Progress Notes (Signed)
   [facility] Visit  Patient name: Cindy Thompson MRN [id number]  Date of birth: 05-21-1986  CC & HPI:  Cindy Thompson is a 29 y.o. female presenting today for intermittent pelvic pain and urinary urgency. Her menstrual period usually lasts 2-3 days. She states every month is different. She endorses that her LNMP was last month. Pt has an IUD in place. Pt reports relief with bowel movement.   ROS:  10 Systems reviewed and all are negative for acute change except as noted in the HPI.  Pertinent History Reviewed:   Reviewed: No PMHx Medical         Past Medical History  Diagnosis Date  . Gestational diabetes   . GERD (gastroesophageal reflux disease)   . Thyroid disease     thyroid nodules                              Surgical Hx:   History reviewed. No pertinent past surgical history. Medications: Reviewed & Updated - see associated section                       Current outpatient prescriptions:  .  levonorgestrel (MIRENA) 20 MCG/24HR IUD, 1 each by Intrauterine route once., Disp: , Rfl:    Social History: Reviewed -  reports that she has never smoked. She has never used smokeless tobacco.  Objective Findings:  Vitals: Blood pressure 80/50, pulse 64, height 5' (1.524 m), weight 99 lb 12.8 oz (45.269 kg).  Physical Examination: General appearance - alert, well appearing, and in no distress Mental status - alert, oriented to person, place, and time Pelvic - normal external genitalia, vulva, cervix, uterus and adnexa. yellow vaginal discharge. Bimanual exam normal. Unable to visualize IUD string GU: present posterior E.coli Neurological - alert, oriented, normal speech, no focal findings or movement disorder noted Musculoskeletal - no joint tenderness, deformity or swelling Skin - normal coloration and turgor, no rashes, no suspicious skin lesions noted   Assessment & Plan:   A:  1. Constipation  P:  1. Advised to use stool softener 2x per week, pt has Miralax at home.  Level  4 due to language translator. By signing my name below, I, Jarvis Morgan, attest that this documentation has been prepared under the direction and in the presence of Tilda Burrow, MD. Electronically Signed: Jarvis Morgan, ED Scribe. 07/08/2015. 1:28 PM.  I personally performed the services described in this documentation, which was SCRIBED in my presence. The recorded information has been reviewed and considered accurate. It has been edited as necessary during review. Tilda Burrow, MD

## 2015-07-12 LAB — GC/CHLAMYDIA PROBE AMP
CHLAMYDIA, DNA PROBE: NEGATIVE
NEISSERIA GONORRHOEAE BY PCR: NEGATIVE

## 2015-10-25 ENCOUNTER — Encounter: Payer: Self-pay | Admitting: Women's Health

## 2015-10-25 ENCOUNTER — Ambulatory Visit (INDEPENDENT_AMBULATORY_CARE_PROVIDER_SITE_OTHER): Payer: BLUE CROSS/BLUE SHIELD | Admitting: Women's Health

## 2015-10-25 VITALS — BP 96/54 | HR 72 | Wt 106.0 lb

## 2015-10-25 DIAGNOSIS — N644 Mastodynia: Secondary | ICD-10-CM | POA: Insufficient documentation

## 2015-10-25 NOTE — Progress Notes (Signed)
Patient ID: Cindy Thompson, female   DOB: 02-19-1986, 30 y.o.   MRN: [id number]   [facility] Visit  Patient name: Cindy Thompson MRN [id number]  Date of birth: 1986/08/17  CC & HPI:  Cindy Thompson is a 30 y.o. [phone] Asian female presenting today for report of intermittent Rt breast pain x 2 months. Sometimes has pain in Lt breast as well. Not really drinking much caffeine. Thinks bra may be too tight. Has been stressed lately and thinks that may have something to do w/ it. Also started taking 'sleep medicine' that was rx'd by another doctore ~17mths ago. Denies lump/bump/discharge.   Patient's last menstrual period was 10/22/2015. The current method of family planning is IUD. Last pap up to date  Pertinent History Reviewed:  Medical & Surgical Hx:   Past Medical History  Diagnosis Date  . Gestational diabetes   . GERD (gastroesophageal reflux disease)   . Thyroid disease     thyroid nodules   History reviewed. No pertinent past surgical history. Medications: Reviewed & Updated - see associated section Social History: Reviewed -  reports that she has never smoked. She has never used smokeless tobacco.  Objective Findings:  Vitals: BP 96/54 mmHg  Pulse 72  Wt 106 lb (48.081 kg)  LMP 10/22/2015 Body mass index is 20.7 kg/(m^2).  Physical Examination: General appearance - alert, well appearing, and in no distress Breasts - Lt normal w/o masses/tenderness- nipple inverted- states it has 'always' been that way as long as she can remember. Rt breast w/o masses/tenderness to palpation- nipple everted- states it used to be inverted but came out ~52yrs ago during 1st pregnancy- no abnormalities noted to either breast, no lymph nodes   No results found for this or any previous visit (from the past 24 hour(s)).   Assessment & Plan:  A:   Intermittent bilateral breast pain, R>L  P:  Change bras, no caffeine, de-stress  Let us know if worsening or notices any new sx  Return for prn.  Marge Duncans CNM, [facility] 10/25/2015 12:39 PM

## 2016-01-18 ENCOUNTER — Encounter: Payer: Self-pay | Admitting: Advanced Practice Midwife

## 2016-01-18 ENCOUNTER — Ambulatory Visit (INDEPENDENT_AMBULATORY_CARE_PROVIDER_SITE_OTHER): Payer: BLUE CROSS/BLUE SHIELD | Admitting: Advanced Practice Midwife

## 2016-01-18 DIAGNOSIS — N760 Acute vaginitis: Secondary | ICD-10-CM | POA: Diagnosis not present

## 2016-01-18 MED ORDER — FLUCONAZOLE 150 MG PO TABS: ORAL_TABLET | ORAL | Status: DC

## 2016-01-18 NOTE — Progress Notes (Signed)
   [facility] Visit  Patient name: Cindy Thompson MRN [id number]  Date of birth: [birth year]  CC & HPI:  Cindy SenateLei Wildes is a 30 y.o. Asian female presenting today for vulvar itch for a week.  Used monistat 2 times, but felt like it made her swollen.  Itch is definitely better, but she "wants it gone!".  Itch is mainly external.   Pertinent History Reviewed:  Medical & Surgical Hx:   Past Medical History  Diagnosis Date  . Gestational diabetes   . GERD (gastroesophageal reflux disease)   . Thyroid disease     thyroid nodules   History reviewed. No pertinent past surgical history. Family History  Problem Relation Age of Onset  . Suicidality Father   . Cancer Paternal Grandmother     Current outpatient prescriptions:  .  levonorgestrel (MIRENA) 20 MCG/24HR IUD, 1 each by Intrauterine route once., Disp: , Rfl:  .  fluconazole (DIFLUCAN) 150 MG tablet, 1 po stat; repeat in 3 days, Disp: 2 tablet, Rfl: 2 Social History: Reviewed -  reports that she has never smoked. She has never used smokeless tobacco.  Review of Systems:   Constitutional: Negative for fever and chills Eyes: Negative for visual disturbances Respiratory: Negative for shortness of breath, dyspnea Cardiovascular: Negative for chest pain or palpitations  Gastrointestinal: Negative for vomiting, diarrhea and constipation; no abdominal pain Genitourinary: Negative for dysuria and urgency, Musculoskeletal: Negative for back pain, joint pain, myalgias  Neurological: Negative for dizziness and headaches    Objective Findings:    Physical Examination: General appearance - well appearing, and in no distress Mental status - alert, oriented to person, place, and time Chest:  Normal respiratory effort Heart - normal rate and regular rhythm Abdomen:  Soft, nontender Pelvic: Vulva sl erythemous; SSE:  Small amount of white dc; no odor. Wet prep small yeast only Musculoskeletal:  Normal range of motion without  pain Extremities:  No edema    No results found for this or any previous visit (from the past 24 hour(s)).    Assessment & Plan:  A:   Probable yeast infection, nearly cleared. P:  Rx diflucan 150mg ; hydrocortisone 2% to vulva for itch   Return if symptoms worsen or fail to improve.  CRESENZO-DISHMAN,Inanna Telford CNM 01/18/2016 4:55 PM

## 2016-02-03 ENCOUNTER — Ambulatory Visit (INDEPENDENT_AMBULATORY_CARE_PROVIDER_SITE_OTHER): Payer: BLUE CROSS/BLUE SHIELD | Admitting: Obstetrics & Gynecology

## 2016-02-03 ENCOUNTER — Encounter: Payer: Self-pay | Admitting: Obstetrics & Gynecology

## 2016-02-03 VITALS — BP 90/70 | HR 76 | Wt 110.4 lb

## 2016-02-03 DIAGNOSIS — Z975 Presence of (intrauterine) contraceptive device: Secondary | ICD-10-CM | POA: Diagnosis not present

## 2016-02-03 DIAGNOSIS — N763 Subacute and chronic vulvitis: Secondary | ICD-10-CM | POA: Diagnosis not present

## 2016-02-03 DIAGNOSIS — L739 Follicular disorder, unspecified: Secondary | ICD-10-CM

## 2016-02-03 DIAGNOSIS — L731 Pseudofolliculitis barbae: Secondary | ICD-10-CM

## 2016-02-03 MED ORDER — CEPHALEXIN 500 MG PO CAPS: 500.0000 mg | ORAL_CAPSULE | Freq: Three times a day (TID) | ORAL | Status: DC

## 2016-02-03 MED ORDER — NYSTATIN-TRIAMCINOLONE 100000-0.1 UNIT/GM-% EX OINT: 1.0000 "application " | TOPICAL_OINTMENT | Freq: Two times a day (BID) | CUTANEOUS | Status: DC

## 2016-02-03 MED ORDER — FLUCONAZOLE 150 MG PO TABS: 150.0000 mg | ORAL_TABLET | Freq: Once | ORAL | Status: DC

## 2016-02-03 NOTE — Progress Notes (Signed)
Patient ID: Cindy Thompson, female   DOB: 1985-11-29, 30 y.o.   MRN: [id number]      Chief Complaint  Patient presents with  . work- in- gyn visit    vaginal itching    Blood pressure 90/70, pulse 76, weight 110 lb 6.4 oz (50.077 kg), last menstrual period 01/30/2016.  29 y.o. [id number] Patient's last menstrual period was 01/30/2016. The current method of family planning is IUD.  Subjective Patient has episodic recurrent vaginal itching but she also has some tender areas on her bottom that began recently [name] it[place] [facility]  Objective Vulva:  Multiple areas of folliculitis and the appearance of inflammatory or allergic vulvitis in the vestibule Vagina:  normal mucosa, no discharge Cervix:  no cervical motion tenderness and no lesions Uterus:   Adnexa: ovaries:,      Pertinent ROS No burning with urination, frequency or urgency No nausea, vomiting or diarrhea Nor fever chills or other constitutional symptoms   Labs or studies     Impression Diagnoses this Encounter::   ICD-9-CM ICD-10-CM   1. Folliculitis of perineum 704.8 L73.1     Established relevant diagnosis(es):   Plan/Recommendations: Meds ordered this encounter  Medications  . Hydrocortisone Acetate (MICORT-HC) 2.5 % CREA    Sig: Apply topically.  . cephALEXin (KEFLEX) 500 MG capsule    Sig: Take 1 capsule (500 mg total) by mouth 3 (three) times daily.    Dispense:  21 capsule    Refill:  0  . fluconazole (DIFLUCAN) 150 MG tablet    Sig: Take 1 tablet (150 mg total) by mouth once. Take the second tablet 3 days after the first one.    Dispense:  2 tablet    Refill:  0  . nystatin-triamcinolone ointment (MYCOLOG)    Sig: Apply 1 application topically 2 (two) times daily.    Dispense:  30 g    Refill:  11    Labs or Scans Ordered: No orders of the defined types were placed in this encounter.    Management:: Folliculitis treat with Keflex 500 mg 3 times a day for  7 day she's given local care instructions regarding shaving etc. Has recurrent itching because of swimming, given mytrex for that  Follow up Return if symptoms worsen or fail to improve.        Face to face time:  15 minutes  Greater than 50% of the visit time was spent in counseling and coordination of care with the patient.  The summary and outline of the counseling and care coordination is summarized in the note above.   All questions were answered.

## 2016-02-06 IMAGING — US US SOFT TISSUE HEAD/NECK
1 series · 13 of 25 positions shown · non-contrast
Comparison: None on file

CLINICAL DATA: Known history of goiter and thyroid nodules

EXAM:
THYROID ULTRASOUND
TECHNIQUE: Ultrasound examination of the thyroid gland and adjacent soft
tissues was performed.

[Series 1: us soft tissue head/neck · 0.08mm/px · 13 of 27 slices shown]
[im 1/27]
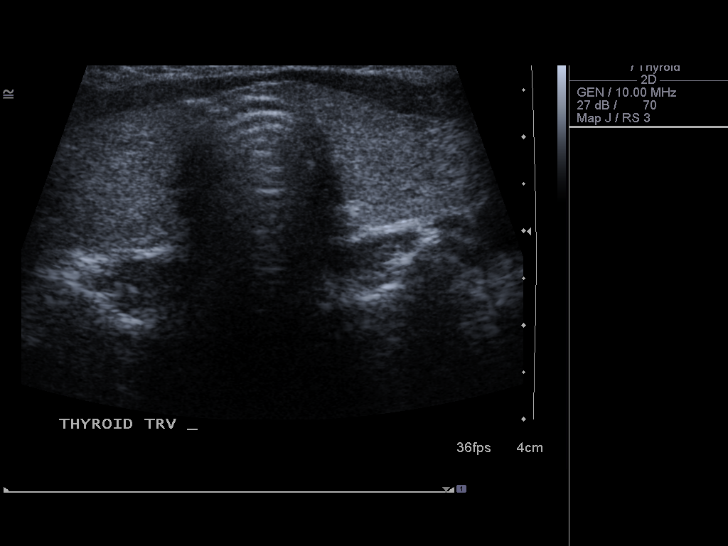
[im 3/27]
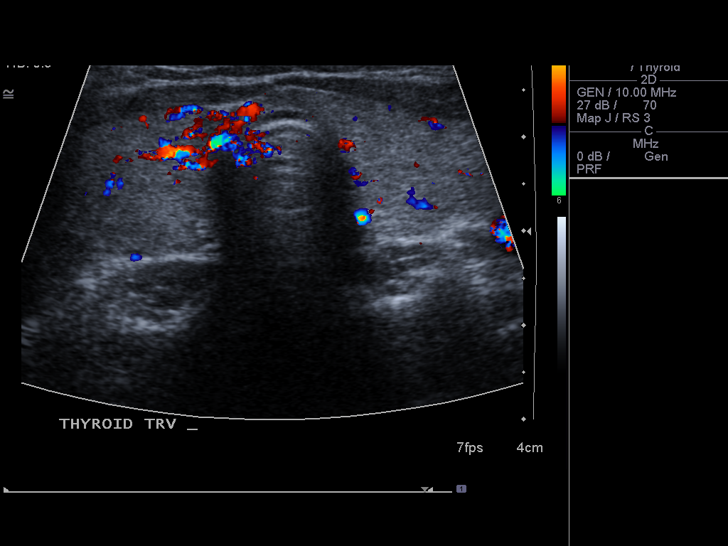
[im 5/27]
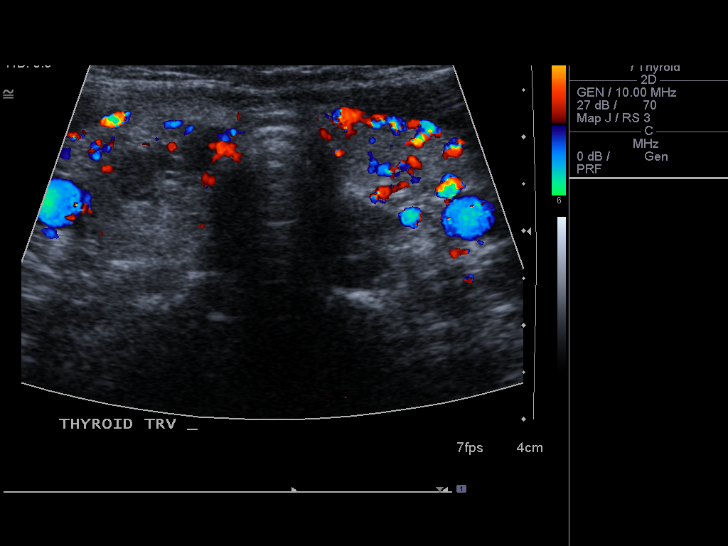
[im 7/27]
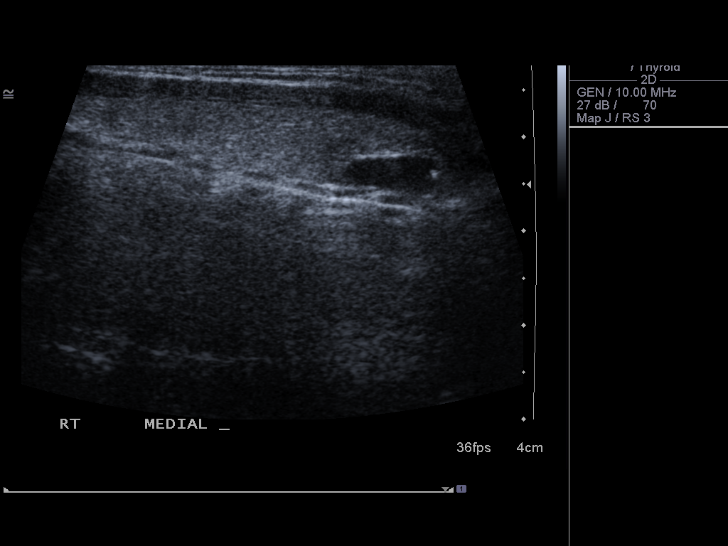
[im 9/27]
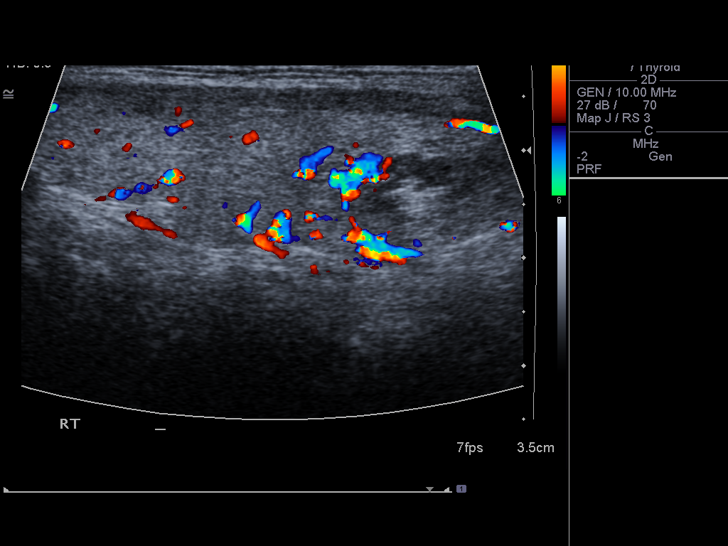
[im 11/27]
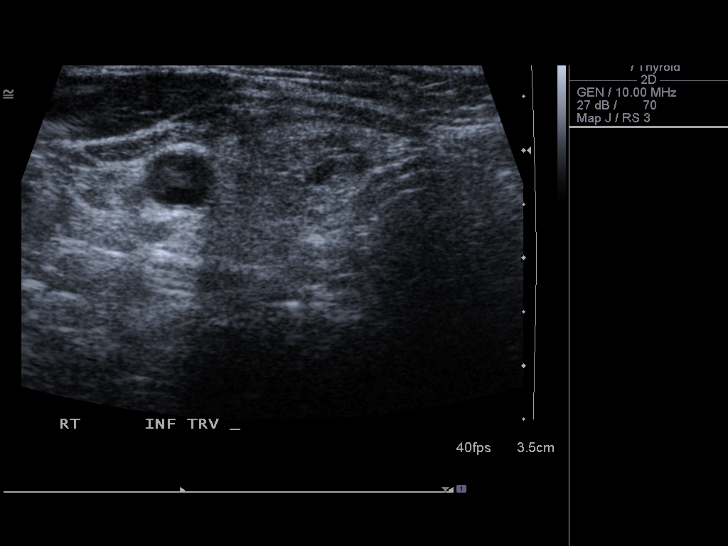
[im 14/27]
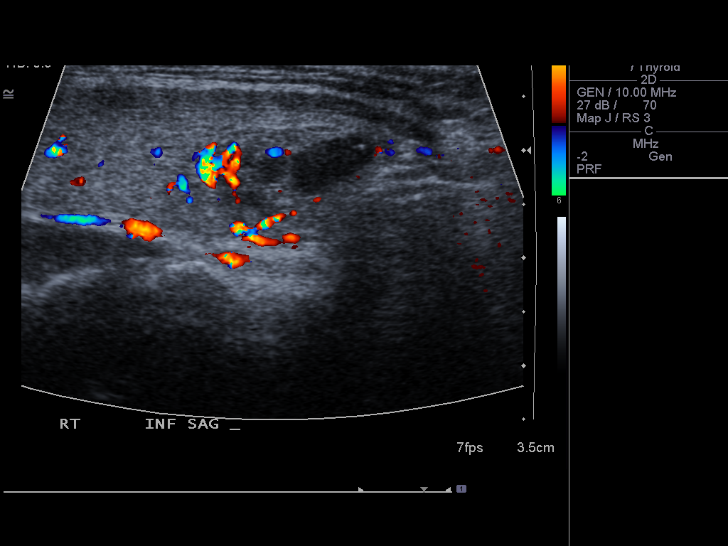
[im 16/27]
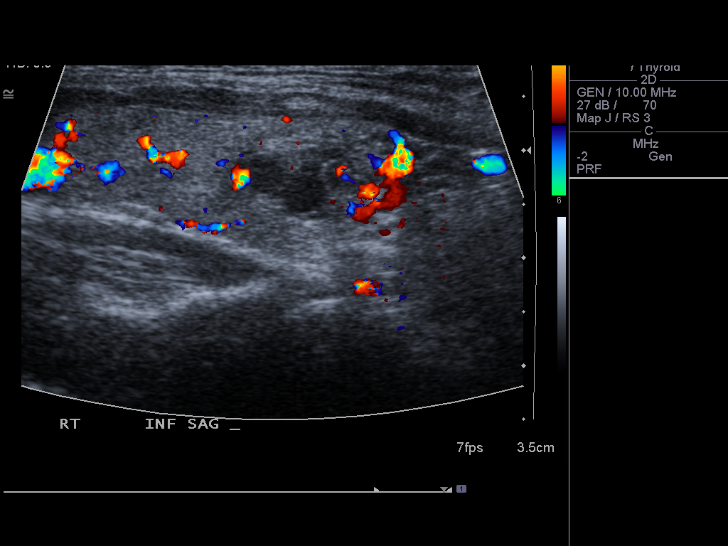
[im 18/27]
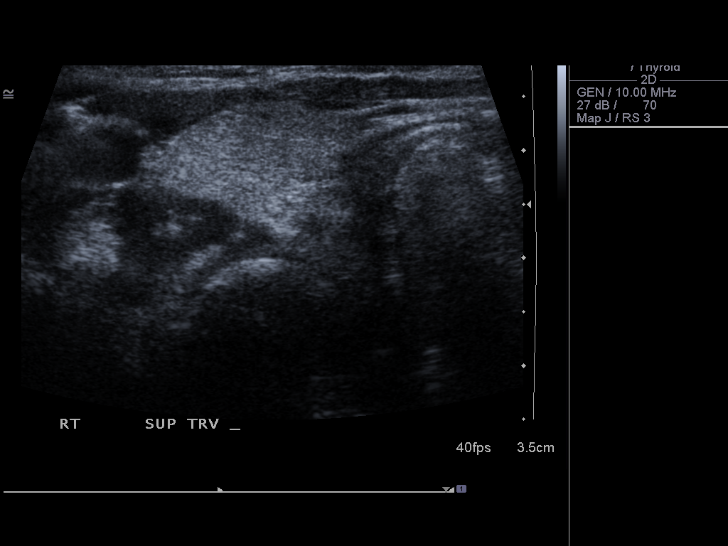
[im 20/27]
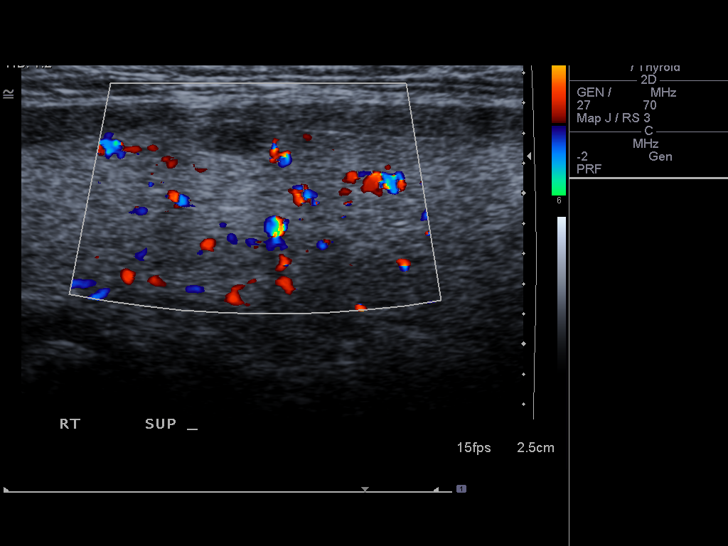
[im 22/27]
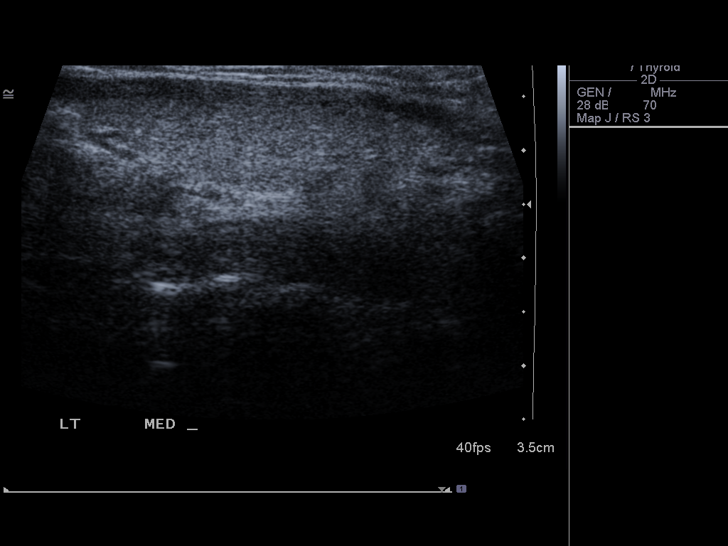
[im 24/27]
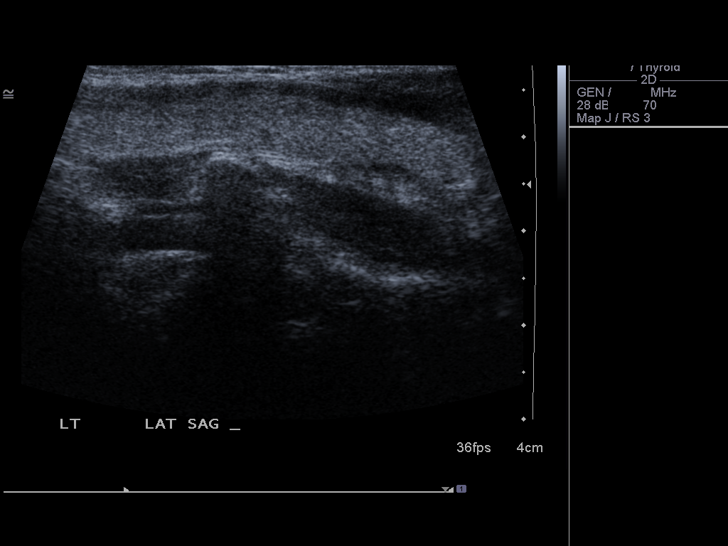
[im 27/27]
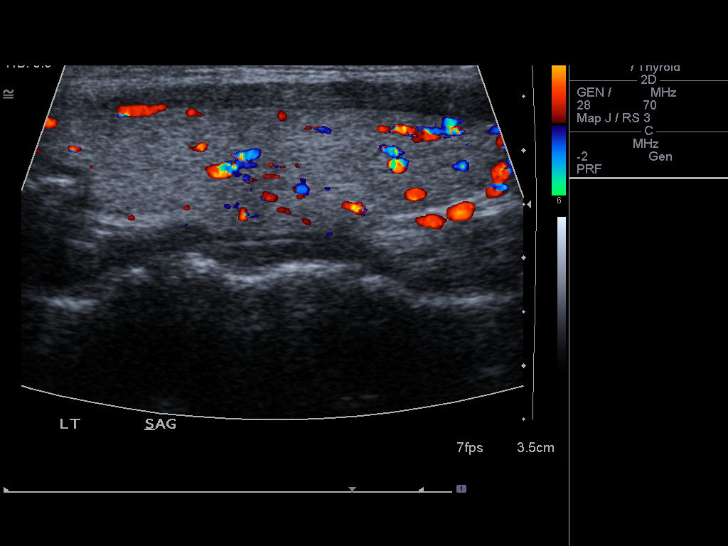

[13 of 25 positions shown; findings below may reference images not displayed]

FINDINGS: Right thyroid lobe

Measurements: 6.5 x 1.4 x 1.8 cm.. In the medial aspect of the upper
pole of the right thyroid lobe there is a 1.9 x 0.9 x 1.5 cm
diameter complex appearing nodule. No calcifications are evident.
Vascularity is moderately increased around the nodule.

Left thyroid lobe

Measurements: 5.2 x 0.9 x 2.0 cm.  No nodules visualized.

Isthmus

Thickness: 0.2 cm.  No nodules visualized.

Lymphadenopathy

None visualized.
IMPRESSION: There is a goiter with the right thyroid lobe being larger than the
left. A complex appearing nodule with mixed cystic and solid
components is present and measures 1.9 cm in greatest dimension. The
nodule is slightly less than the threshold of 2 cm but giving the
mixed echogenicity of the nodule, biopsy should be considered.
Ultrasound-guided fine needle aspiration should be considered, as
per the consensus statement: Management of Thyroid Nodules Detected
at US: Society of Radiologists in Ultrasound Consensus Conference

## 2016-02-08 ENCOUNTER — Telehealth: Payer: Self-pay | Admitting: Advanced Practice Midwife

## 2016-02-08 MED ORDER — HYDROCORTISONE 2.5 % EX CREA: TOPICAL_CREAM | Freq: Two times a day (BID) | CUTANEOUS | Status: DC

## 2016-02-08 NOTE — Addendum Note (Signed)
Addended by: Jacklyn ShellRESENZO-DISHMON, Wendolyn Raso on: 02/08/2016 04:22 PM   Modules accepted: Orders

## 2016-02-08 NOTE — Telephone Encounter (Signed)
Drenda FreezeFran spoke with pt and cream left at front desk for pt to pick up.

## 2016-02-08 NOTE — Telephone Encounter (Signed)
Pt called stating that fran has given her a cream for itching here in the office and she would like to know if fran could give her another sample of the cream. Please contact pt

## 2016-02-13 ENCOUNTER — Encounter: Payer: Self-pay | Admitting: Women's Health

## 2016-02-13 ENCOUNTER — Ambulatory Visit (INDEPENDENT_AMBULATORY_CARE_PROVIDER_SITE_OTHER): Payer: BLUE CROSS/BLUE SHIELD | Admitting: Women's Health

## 2016-02-13 ENCOUNTER — Other Ambulatory Visit (HOSPITAL_COMMUNITY)
Admission: RE | Admit: 2016-02-13 | Discharge: 2016-02-13 | Disposition: A | Discharge status: Home / Self Care | Payer: BLUE CROSS/BLUE SHIELD | Source: Ambulatory Visit | Attending: Obstetrics & Gynecology | Admitting: Obstetrics & Gynecology

## 2016-02-13 VITALS — BP 100/76 | HR 62 | Ht 58.5 in | Wt 110.5 lb

## 2016-02-13 DIAGNOSIS — R1030 Lower abdominal pain, unspecified: Secondary | ICD-10-CM | POA: Diagnosis not present

## 2016-02-13 DIAGNOSIS — Z01411 Encounter for gynecological examination (general) (routine) with abnormal findings: Secondary | ICD-10-CM

## 2016-02-13 DIAGNOSIS — Z113 Encounter for screening for infections with a predominantly sexual mode of transmission: Secondary | ICD-10-CM | POA: Insufficient documentation

## 2016-02-13 DIAGNOSIS — L292 Pruritus vulvae: Secondary | ICD-10-CM | POA: Diagnosis not present

## 2016-02-13 DIAGNOSIS — Z01419 Encounter for gynecological examination (general) (routine) without abnormal findings: Secondary | ICD-10-CM | POA: Insufficient documentation

## 2016-02-13 DIAGNOSIS — Z1151 Encounter for screening for human papillomavirus (HPV): Secondary | ICD-10-CM | POA: Insufficient documentation

## 2016-02-13 DIAGNOSIS — K5900 Constipation, unspecified: Secondary | ICD-10-CM

## 2016-02-13 DIAGNOSIS — Z975 Presence of (intrauterine) contraceptive device: Secondary | ICD-10-CM

## 2016-02-13 LAB — POCT WET PREP (WET MOUNT): CLUE CELLS WET PREP WHIFF POC: NEGATIVE

## 2016-02-13 NOTE — Progress Notes (Signed)
Patient ID: Cindy Thompson, female   DOB: [birth year], 30 y.o.   MRN: [id number] Subjective:   Cindy SenateLei Foree is a 30 y.o. [phone] Asian female here for a routine well-woman exam.  Patient's last menstrual period was 01/30/2016.    Current complaints: occ vulvovaginal itching, uses hydrocortisone cream as rx'd previously which helps. Denies odor/irritation/abnormal d/c. Occ has lower abd pain, does have constipation, uses miralax which does help when she takes it. Not due for pap until next year, pt desires today.  PCP: Maximino SarinLayne Weaver       Does not desire labs, will get w/ PCP  Social History: Sexual: heterosexual Marital Status: married Living situation: with family Occupation: Medical sales representativeowner/cashier/waitress/cooking at [employer] Tobacco/alcohol: no tobacco or etoh  Illicit drugs: no history of illicit drug use  The following portions of the patient's history were reviewed and updated as appropriate: allergies, current medications, past family history, past medical history, past social history, past surgical history and problem list.  Past Medical History Past Medical History  Diagnosis Date  . Gestational diabetes   . GERD (gastroesophageal reflux disease)   . Thyroid disease     thyroid nodules    Past Surgical History History reviewed. No pertinent past surgical history.  Gynecologic History [id number]  Patient's last menstrual period was 01/30/2016. Contraception: IUD Mirena placed 01/14/14 Last Pap: 01/14/14. Results were: normal Last mammogram: never. Results were: n/a Last TCS: never  Obstetric History OB History  Gravida Para Term Preterm AB SAB TAB Ectopic Multiple Living  4 3 3  1 1    3     # Outcome Date GA Lbr Len/2nd Weight Sex Delivery Anes PTL Lv  4 Term 01/11/13 5867w1d 01:23 / 00:03 8 lb 8 oz (3.856 kg) F Vag-Spont Local  Y     Comments: None  3 SAB 2012          2 Term 03/2010 2074w0d  7 lb 11 oz (3.487 kg) M Vag-Spont  N Y  1 Term 2007 7374w0d  9 lb (4.082 kg) F Vag-Spont  N Y       Current Medications Current Outpatient Prescriptions on File Prior to Visit  Medication Sig Dispense Refill  . cephALEXin (KEFLEX) 500 MG capsule Take 1 capsule (500 mg total) by mouth 3 (three) times daily. 21 capsule 0  . levonorgestrel (MIRENA) 20 MCG/24HR IUD 1 each by Intrauterine route once.    . fluconazole (DIFLUCAN) 150 MG tablet Take 1 tablet (150 mg total) by mouth once. Take the second tablet 3 days after the first one. (Patient not taking: Reported on 02/13/2016) 2 tablet 0  . hydrocortisone 2.5 % cream Apply topically 2 (two) times daily. (Patient not taking: Reported on 02/13/2016) 30 g 3   No current facility-administered medications on file prior to visit.    Review of Systems Patient denies any headaches, blurred vision, shortness of breath, chest pain, abdominal pain, problems with bowel movements, urination, or intercourse.  Objective:  BP 100/76 mmHg  Pulse 62  Ht 4' 10.5" (1.486 m)  Wt 110 lb 8 oz (50.122 kg)  BMI 22.70 kg/m2  LMP 01/30/2016 Physical Exam  General:  Well developed, well nourished, no acute distress. She is alert and oriented x3. Skin:  Warm and dry Neck:  Midline trachea, thyroid enlarged, has known Rt thyroid nodule per u/s 2016 Cardiovascular: Regular rate and rhythm, no murmur heard Lungs:  Effort normal, all lung fields clear to auscultation bilaterally Breasts:  No dominant palpable mass, retraction, or nipple  discharge. Lt nipple inverted- states has always been this way.  Abdomen:  Soft, non tender, no hepatosplenomegaly or masses Pelvic:  External genitalia is normal in appearance.  The vagina is normal in appearance. The cervix is bulbous, no CMT, tilted to pt's Rt, IUD strings not visible.  Thin prep pap is done w/ HR HPV cotesting. Uterus is felt to be normal size, shape, and contour.  No adnexal masses or tenderness noted. Extremities:  No swelling or varicosities noted Psych:  She has a normal mood and affect  Results for orders  placed or performed in visit on 02/13/16 (from the past 24 hour(s))  POCT Wet Prep Mellody Drown [place])     Status: Normal   Collection Time: 02/13/16 11:24 AM  Result Value Ref Range   Source Wet Prep POC vaginal    WBC, Wet Prep HPF POC none    Bacteria Wet Prep HPF POC None None, Few, Too numerous to count   BACTERIA WET PREP MORPHOLOGY POC     Clue Cells Wet Prep HPF POC None None, Too numerous to count   Clue Cells Wet Prep Whiff POC Negative Whiff    Yeast Wet Prep HPF POC None    KOH Wet Prep POC     Trichomonas Wet Prep HPF POC none     Informal transabdominal u/s:  IUD in correct placement at uterine fundus per myself and amber, ultrasonographer Assessment:   Healthy well-woman exam Known thyromegaly w/ known Rt thyroid nodule Vulvovaginal itching, normal wet prep Constipation IUD strings not visible, correct IUD placement confirmed  Plan:  GC/CT from pap F/U w/ PCP for screening labs, thyroid f/u Can continue using hydrocortisone cream prn for vulvar itching, wet prep neg Discussed constipation prevention/relief measures, can use Miralax daily- if abd pain doesn't improve can let us know F/U 28yr for physical, or sooner if needed Mammogram  or sooner if problems Colonoscopy  or sooner if problems  Marge Duncans CNM, WHNP-BC 02/13/2016 11:17 AM

## 2016-02-15 LAB — CYTOLOGY - PAP

## 2016-03-13 DIAGNOSIS — H6092 Unspecified otitis externa, left ear: Secondary | ICD-10-CM | POA: Diagnosis not present

## 2016-03-13 DIAGNOSIS — L309 Dermatitis, unspecified: Secondary | ICD-10-CM | POA: Diagnosis not present

## 2016-04-11 ENCOUNTER — Ambulatory Visit: Payer: BLUE CROSS/BLUE SHIELD | Admitting: Family Medicine

## 2016-04-11 ENCOUNTER — Encounter: Payer: Self-pay | Admitting: Family Medicine

## 2016-04-11 ENCOUNTER — Ambulatory Visit (INDEPENDENT_AMBULATORY_CARE_PROVIDER_SITE_OTHER): Payer: BLUE CROSS/BLUE SHIELD | Admitting: Family Medicine

## 2016-04-11 VITALS — BP 104/67 | HR 70 | Temp 98.0°F | Resp 18 | Wt 110.0 lb

## 2016-04-11 DIAGNOSIS — T7840XA Allergy, unspecified, initial encounter: Secondary | ICD-10-CM | POA: Diagnosis not present

## 2016-04-11 MED ORDER — CETIRIZINE HCL 10 MG PO TABS: 10.0000 mg | ORAL_TABLET | Freq: Every day | ORAL | Status: DC

## 2016-04-11 NOTE — Progress Notes (Signed)
Patient ID: Cindy Thompson, female   DOB: 1985-11-15, 30 y.o.   MRN: [id number]    Cindy Thompson , 1985-11-15, 30 y.o., female MRN: [id number] Patient Care Team    Relationship Specialty Notifications Start End  Cindy Leatherwoodenee A Mayfield Schoene, DO PCP - General Family Medicine  04/11/16     CC: pruiritis Subjective: Pt presents for an acute OV with complaints of ithiness of 2-3 weeks duration.  Associated symptoms include nothing. She denies rash, insect bites, allergies, itchy eyes/nose. No one in the household is also affected. She purchased a new kitten prior to onset. She has never owned a Administrator, artspet prior. She has had a similar reaction when she would play with her neighbors dog. She states the symptoms only occur when she is in her house, and now sometimes in her car. If she is outside the home it is not bad. She has tried some cream, but itching is diffuse and no rash. She also has been taking more frequent baths 2x daily and that helps temporarily. She has not changes any household products or personal hygiene products. No Known Allergies Social History  Substance Use Topics  . Smoking status: Never Smoker   . Smokeless tobacco: Never Used  . Alcohol Use: No   Past Medical History  Diagnosis Date  . Gestational diabetes   . GERD (gastroesophageal reflux disease)   . Thyroid disease     thyroid nodules   History reviewed. No pertinent past surgical history. Family History  Problem Relation Age of Onset  . Suicidality Father   . Cancer Paternal Grandmother      Medication List       This list is accurate as of: 04/11/16  2:18 PM.  Always use your most recent med list.               hydrocortisone 2.5 % cream     levonorgestrel 20 MCG/24HR IUD  Commonly known as:  MIRENA  1 each by Intrauterine route once.        No results found for this or any previous visit (from the past 24 hour(s)). No results found.   ROS: Negative, with the exception of above mentioned in HPI   Objective:  BP 104/67 mmHg   Pulse 70  Temp(Src) 98 F (36.7 C)  Resp 18  Wt 110 lb (49.896 kg)  SpO2 98% Body mass index is 22.6 kg/(m^2). Gen: Afebrile. No acute distress. Nontoxic in appearance, well developed, well nourished.  HENT: AT. [place]. MMM, no oral lesions.  Eyes:Pupils Equal Round Reactive to light, Extraocular movements intact,  Conjunctiva without redness, discharge or icterus. Chest: CTAB, no wheeze or crackles. Good air movement, normal resp effort.  Skin: no rashes, purpura or petechiae.  Neuro:  Normal gait. PERLA. EOMi. Alert. Oriented x3   Assessment/Plan: Cindy Thompson is a 30 y.o. female present for acute OV for  Allergic reaction, initial encounter - Likely secondary to cat/pet dander allergy. No rash/insect bite on exam on exam. No associated symptoms.  - cetirizine (ZYRTEC) 10 MG tablet; Take 1 tablet (10 mg total) by mouth daily.  Dispense: 90 tablet; Refill: 1' - F/U PRN  electronically signed by:  Felix Pacinienee [name], DO  [facility] - [place]

## 2016-04-11 NOTE — Patient Instructions (Signed)
It was a pleasure to meet you today. I have called in Zyrtec for you to take every night. I believe you are likely allergic to animal dander.

## 2016-05-21 ENCOUNTER — Ambulatory Visit (INDEPENDENT_AMBULATORY_CARE_PROVIDER_SITE_OTHER): Payer: BLUE CROSS/BLUE SHIELD | Admitting: Family Medicine

## 2016-05-21 ENCOUNTER — Encounter: Payer: Self-pay | Admitting: Family Medicine

## 2016-05-21 VITALS — BP 101/67 | HR 56 | Temp 98.1°F | Resp 18 | Ht 59.0 in | Wt 110.5 lb

## 2016-05-21 DIAGNOSIS — Z136 Encounter for screening for cardiovascular disorders: Secondary | ICD-10-CM | POA: Diagnosis not present

## 2016-05-21 DIAGNOSIS — Z130 Encounter for screening for diseases of the blood and blood-forming organs and certain disorders involving the immune mechanism: Secondary | ICD-10-CM | POA: Diagnosis not present

## 2016-05-21 DIAGNOSIS — Z0001 Encounter for general adult medical examination with abnormal findings: Secondary | ICD-10-CM | POA: Diagnosis not present

## 2016-05-21 DIAGNOSIS — Z1329 Encounter for screening for other suspected endocrine disorder: Secondary | ICD-10-CM

## 2016-05-21 DIAGNOSIS — Z131 Encounter for screening for diabetes mellitus: Secondary | ICD-10-CM | POA: Diagnosis not present

## 2016-05-21 DIAGNOSIS — H9390 Unspecified disorder of ear, unspecified ear: Secondary | ICD-10-CM | POA: Insufficient documentation

## 2016-05-21 DIAGNOSIS — Z1321 Encounter for screening for nutritional disorder: Secondary | ICD-10-CM | POA: Diagnosis not present

## 2016-05-21 DIAGNOSIS — Z13220 Encounter for screening for lipoid disorders: Secondary | ICD-10-CM

## 2016-05-21 DIAGNOSIS — H61899 Other specified disorders of external ear, unspecified ear: Secondary | ICD-10-CM

## 2016-05-21 LAB — COMPREHENSIVE METABOLIC PANEL
ALT: 11 U/L (ref 0–35)
AST: 14 U/L (ref 0–37)
Albumin: 4.9 g/dL (ref 3.5–5.2)
Alkaline Phosphatase: 43 U/L (ref 39–117)
BUN: 11 mg/dL (ref 6–23)
CHLORIDE: 104 meq/L (ref 96–112)
CO2: 28 meq/L (ref 19–32)
Calcium: 9.2 mg/dL (ref 8.4–10.5)
Creatinine, Ser: 0.57 mg/dL (ref 0.40–1.20)
GFR: 130.42 mL/min (ref >60.00)
GLUCOSE: 74 mg/dL (ref 70–99)
POTASSIUM: 4.1 meq/L (ref 3.5–5.1)
SODIUM: 141 meq/L (ref 135–145)
Total Bilirubin: 0.7 mg/dL (ref 0.2–1.2)
Total Protein: 7.4 g/dL (ref 6.0–8.3)

## 2016-05-21 LAB — LIPID PANEL
CHOLESTEROL: 150 mg/dL (ref 0–200)
HDL: 50.1 mg/dL (ref >39.00)
LDL CALC: 87 mg/dL (ref 0–99)
NonHDL: 100.02
TRIGLYCERIDES: 67 mg/dL (ref 0.0–149.0)
Total CHOL/HDL Ratio: 3
VLDL: 13.4 mg/dL (ref 0.0–40.0)

## 2016-05-21 LAB — CBC WITH DIFFERENTIAL/PLATELET
Basophils Absolute: 0 10*3/uL (ref 0.0–0.1)
Basophils Relative: 0.5 % (ref 0.0–3.0)
EOS PCT: 1.1 % (ref 0.0–5.0)
Eosinophils Absolute: 0.1 10*3/uL (ref 0.0–0.7)
HCT: 38.3 % (ref 36.0–46.0)
Hemoglobin: 13.2 g/dL (ref 12.0–15.0)
LYMPHS ABS: 3.5 10*3/uL (ref 0.7–4.0)
Lymphocytes Relative: 42.4 % (ref 12.0–46.0)
MCHC: 34.4 g/dL (ref 30.0–36.0)
MCV: 94.5 fl (ref 78.0–100.0)
MONO ABS: 0.7 10*3/uL (ref 0.1–1.0)
MONOS PCT: 8.1 % (ref 3.0–12.0)
NEUTROS ABS: 3.9 10*3/uL (ref 1.4–7.7)
NEUTROS PCT: 47.9 % (ref 43.0–77.0)
PLATELETS: 210 10*3/uL (ref 150.0–400.0)
RBC: 4.06 Mil/uL (ref 3.87–5.11)
RDW: 11.9 % (ref 11.5–15.5)
WBC: 8.2 10*3/uL (ref 4.0–10.5)

## 2016-05-21 LAB — HEMOGLOBIN A1C: Hgb A1c MFr Bld: 5.2 % (ref 4.6–6.5)

## 2016-05-21 LAB — VITAMIN D 25 HYDROXY (VIT D DEFICIENCY, FRACTURES): VITD: 30.99 ng/mL (ref 30.00–100.00)

## 2016-05-21 LAB — TSH: TSH: 1.69 u[IU]/mL (ref 0.35–4.50)

## 2016-05-21 NOTE — Patient Instructions (Signed)
Health Maintenance, Female Adopting a healthy lifestyle and getting preventive care can go a long way to promote health and wellness. Talk with your health care provider about what schedule of regular examinations is right for you. This is a good chance for you to check in with your provider about disease prevention and staying healthy. In between checkups, there are plenty of things you can do on your own. Experts have done a lot of research about which lifestyle changes and preventive measures are most likely to keep you healthy. Ask your health care provider for more information. WEIGHT AND DIET  Eat a healthy diet  Be sure to include plenty of vegetables, fruits, low-fat dairy products, and lean protein.  Do not eat a lot of foods high in solid fats, added sugars, or salt.  Get regular exercise. This is one of the most important things you can do for your health.  Most adults should exercise for at least 150 minutes each week. The exercise should increase your heart rate and make you sweat (moderate-intensity exercise).  Most adults should also do strengthening exercises at least twice a week. This is in addition to the moderate-intensity exercise.  Maintain a healthy weight  Body mass index (BMI) is a measurement that can be used to identify possible weight problems. It estimates body fat based on height and weight. Your health care provider can help determine your BMI and help you achieve or maintain a healthy weight.  For females 52 years of age and older:   A BMI below 18.5 is considered underweight.  A BMI of 18.5 to 24.9 is normal.  A BMI of 25 to 29.9 is considered overweight.  A BMI of 30 and above is considered obese.  Watch levels of cholesterol and blood lipids  You should start having your blood tested for lipids and cholesterol at 32 years of age, then have this test every 5 years.  You may need to have your cholesterol levels checked more often if:  Your lipid  or cholesterol levels are high.  You are older than 32 years of age.  You are at high risk for heart disease.  CANCER SCREENING   Lung Cancer  Lung cancer screening is recommended for adults 3-50 years old who are at high risk for lung cancer because of a history of smoking.  A yearly low-dose CT scan of the lungs is recommended for people who:  Currently smoke.  Have quit within the past 15 years.  Have at least a 30-pack-year history of smoking. A pack year is smoking an average of one pack of cigarettes a day for 1 year.  Yearly screening should continue until it has been 15 years since you quit.  Yearly screening should stop if you develop a health problem that would prevent you from having lung cancer treatment.  Breast Cancer  Practice breast self-awareness. This means understanding how your breasts normally appear and feel.  It also means doing regular breast self-exams. Let your health care provider know about any changes, no matter how small.  If you are in your 20s or 30s, you should have a clinical breast exam (CBE) by a health care provider every 1-3 years as part of a regular health exam.  If you are 51 or older, have a CBE every year. Also consider having a breast X-ray (mammogram) every year.  If you have a family history of breast cancer, talk to your health care provider about genetic screening.  If you  are at high risk for breast cancer, talk to your health care provider about having an MRI and a mammogram every year.  Breast cancer gene (BRCA) assessment is recommended for women who have family members with BRCA-related cancers. BRCA-related cancers include:  Breast.  Ovarian.  Tubal.  Peritoneal cancers.  Results of the assessment will determine the need for genetic counseling and BRCA1 and BRCA2 testing. Cervical Cancer Your health care provider may recommend that you be screened regularly for cancer of the pelvic organs (ovaries, uterus, and  vagina). This screening involves a pelvic examination, including checking for microscopic changes to the surface of your cervix (Pap test). You may be encouraged to have this screening done every 3 years, beginning at age 21.  For women ages 30-65, health care providers may recommend pelvic exams and Pap testing every 3 years, or they may recommend the Pap and pelvic exam, combined with testing for human papilloma virus (HPV), every 5 years. Some types of HPV increase your risk of cervical cancer. Testing for HPV may also be done on women of any age with unclear Pap test results.  Other health care providers may not recommend any screening for nonpregnant women who are considered low risk for pelvic cancer and who do not have symptoms. Ask your health care provider if a screening pelvic exam is right for you.  If you have had past treatment for cervical cancer or a condition that could lead to cancer, you need Pap tests and screening for cancer for at least 20 years after your treatment. If Pap tests have been discontinued, your risk factors (such as having a new sexual partner) need to be reassessed to determine if screening should resume. Some women have medical problems that increase the chance of getting cervical cancer. In these cases, your health care provider may recommend more frequent screening and Pap tests. Colorectal Cancer  This type of cancer can be detected and often prevented.  Routine colorectal cancer screening usually begins at 32 years of age and continues through 32 years of age.  Your health care provider may recommend screening at an earlier age if you have risk factors for colon cancer.  Your health care provider may also recommend using home test kits to check for hidden blood in the stool.  A small camera at the end of a tube can be used to examine your colon directly (sigmoidoscopy or colonoscopy). This is done to check for the earliest forms of colorectal  cancer.  Routine screening usually begins at age 50.  Direct examination of the colon should be repeated every 5-10 years through 32 years of age. However, you may need to be screened more often if early forms of precancerous polyps or small growths are found. Skin Cancer  Check your skin from head to toe regularly.  Tell your health care provider about any new moles or changes in moles, especially if there is a change in a mole's shape or color.  Also tell your health care provider if you have a mole that is larger than the size of a pencil eraser.  Always use sunscreen. Apply sunscreen liberally and repeatedly throughout the day.  Protect yourself by wearing long sleeves, pants, a wide-brimmed hat, and sunglasses whenever you are outside. HEART DISEASE, DIABETES, AND HIGH BLOOD PRESSURE   High blood pressure causes heart disease and increases the risk of stroke. High blood pressure is more likely to develop in:  People who have blood pressure in the high end   of the normal range (130-139/85-89 mm Hg).  People who are overweight or obese.  People who are African American.  If you are 38-23 years of age, have your blood pressure checked every 3-5 years. If you are 61 years of age or older, have your blood pressure checked every year. You should have your blood pressure measured twice--once when you are at a hospital or clinic, and once when you are not at a hospital or clinic. Record the average of the two measurements. To check your blood pressure when you are not at a hospital or clinic, you can use:  An automated blood pressure machine at a pharmacy.  A home blood pressure monitor.  If you are between 45 years and 39 years old, ask your health care provider if you should take aspirin to prevent strokes.  Have regular diabetes screenings. This involves taking a blood sample to check your fasting blood sugar level.  If you are at a normal weight and have a low risk for diabetes,  have this test once every three years after 32 years of age.  If you are overweight and have a high risk for diabetes, consider being tested at a younger age or more often. PREVENTING INFECTION  Hepatitis B  If you have a higher risk for hepatitis B, you should be screened for this virus. You are considered at high risk for hepatitis B if:  You were born in a country where hepatitis B is common. Ask your health care provider which countries are considered high risk.  Your parents were born in a high-risk country, and you have not been immunized against hepatitis B (hepatitis B vaccine).  You have HIV or AIDS.  You use needles to inject street drugs.  You live with someone who has hepatitis B.  You have had sex with someone who has hepatitis B.  You get hemodialysis treatment.  You take certain medicines for conditions, including cancer, organ transplantation, and autoimmune conditions. Hepatitis C  Blood testing is recommended for:  Everyone born from [birth year] through 1967.  Anyone with known risk factors for hepatitis C. Sexually transmitted infections (STIs)  You should be screened for sexually transmitted infections (STIs) including gonorrhea and chlamydia if:  You are sexually active and are younger than 32 years of age.  You are older than 32 years of age and your health care provider tells you that you are at risk for this type of infection.  Your sexual activity has changed since you were last screened and you are at an increased risk for chlamydia or gonorrhea. Ask your health care provider if you are at risk.  If you do not have HIV, but are at risk, it may be recommended that you take a prescription medicine daily to prevent HIV infection. This is called pre-exposure prophylaxis (PrEP). You are considered at risk if:  You are sexually active and do not regularly use condoms or know the HIV status of your partner(s).  You take drugs by injection.  You are sexually  active with a partner who has HIV. Talk with your health care provider about whether you are at high risk of being infected with HIV. If you choose to begin PrEP, you should first be tested for HIV. You should then be tested every 3 months for as long as you are taking PrEP.  PREGNANCY   If you are premenopausal and you may become pregnant, ask your health care provider about preconception counseling.  If you may  become pregnant, take 400 to 800 micrograms (mcg) of folic acid every day.  If you want to prevent pregnancy, talk to your health care provider about birth control (contraception). OSTEOPOROSIS AND MENOPAUSE   Osteoporosis is a disease in which the bones lose minerals and strength with aging. This can result in serious bone fractures. Your risk for osteoporosis can be identified using a bone density scan.  If you are 61 years of age or older, or if you are at risk for osteoporosis and fractures, ask your health care provider if you should be screened.  Ask your health care provider whether you should take a calcium or vitamin D supplement to lower your risk for osteoporosis.  Menopause may have certain physical symptoms and risks.  Hormone replacement therapy may reduce some of these symptoms and risks. Talk to your health care provider about whether hormone replacement therapy is right for you.  HOME CARE INSTRUCTIONS   Schedule regular health, dental, and eye exams.  Stay current with your immunizations.   Do not use any tobacco products including cigarettes, chewing tobacco, or electronic cigarettes.  If you are pregnant, do not drink alcohol.  If you are breastfeeding, limit how much and how often you drink alcohol.  Limit alcohol intake to no more than 1 drink per day for nonpregnant women. One drink equals 12 ounces of beer, 5 ounces of wine, or 1 ounces of hard liquor.  Do not use street drugs.  Do not share needles.  Ask your health care provider for help if  you need support or information about quitting drugs.  Tell your health care provider if you often feel depressed.  Tell your health care provider if you have ever been abused or do not feel safe at home.   This information is not intended to replace advice given to you by your health care provider. Make sure you discuss any questions you have with your health care provider.   Document Released: 04/02/2011 Document Revised: 10/08/2014 Document Reviewed: 08/19/2013 Elsevier Interactive Patient Education [employer].

## 2016-05-21 NOTE — Progress Notes (Signed)
Patient ID: Cindy Thompson, female  DOB: Jul 06, 1986, 30 y.o.   MRN: [id number] Patient Care Team    Relationship Specialty Notifications Start End  Ma Hillock, DO PCP - General Family Medicine  04/11/16     Subjective:  Cindy Thompson is a 30 y.o.  Female  present for CPE without PAP.  All past medical history, surgical history, allergies, family history, immunizations, medications and social history were updated in the electronic medical record today. All recent labs, ED visits and hospitalizations within the last year were reviewed.  Health maintenance:  Colonoscopy: screen at 69, no fhx Mammogram: start screen at 16, no fhx Cervical cancer screening: completed with GYN; 01/2016. Immunizations: tdap 2014, Influenza (encouraged yearly) Infectious disease screening: HIV completed 2015 DEXA: screen at 58 Assistive device: none Oxygen HER:DEYC Patient has a Dental home. Hospitalizations/ED visits:None  Depression screen [facility] 2/9 05/21/2016  Decreased Interest 0  Down, Depressed, Hopeless 0  PHQ - 2 Score 0   Fall Risk  05/21/2016  Falls in the past year? No     Immunization History  Administered Date(s) Administered  . Influenza,inj,Quad PF,36+ Mos 07/12/2014  . Tdap 01/13/2013     Past Medical History:  Diagnosis Date  . Allergy   . GERD (gastroesophageal reflux disease)   . Thyroid disease    thyroid nodules   No Known Allergies History reviewed. No pertinent surgical history. Family History  Problem Relation Age of Onset  . Suicidality Father   . Ovarian cancer Paternal Grandmother    Social History   Social History  . Marital status: Married    Spouse name: N/A  . Number of children: 3  . Years of education: N/A   Occupational History  . restaurant owner [employer]   Social History Main Topics  . Smoking status: Never Smoker  . Smokeless tobacco: Never Used  . Alcohol use No  . Drug use: No  . Sexual activity: Yes    Birth control/ protection: IUD    Other Topics Concern  . 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 She lives in [place]. Her mother helps her with children.      Medication List       Accurate as of 05/21/16 12:44 PM. Always use your most recent med list.          cetirizine 10 MG tablet Commonly known as:  ZYRTEC Take 1 tablet (10 mg total) by mouth daily.   hydrocortisone 2.5 % cream   levonorgestrel 20 MCG/24HR IUD Commonly known as:  MIRENA 1 each by Intrauterine route once.        No results found for this or any previous visit (from the past 2160 hour(s)).  No results found.   ROS: 14 pt review of systems performed and negative (unless mentioned in an HPI)  Objective: BP 101/67 (BP Location: Right Arm, Patient Position: Sitting, Cuff Size: Normal)   Pulse (!) 56   Temp 98.1 F (36.7 C)   Resp 18   Ht '4\' 11"'  (1.499 m)   Wt 110 lb 8 oz (50.1 kg)   SpO2 98%   BMI 22.32 kg/m  Gen: Afebrile. No acute distress. Nontoxic in appearance, well-developed, well-nourished,  Talkative asian female.  HENT: AT. [place]. Bilateral TM visualized and normal in appearance, normal external auditory canal right. Left EAM with ~28m scabbed area proximal EAM.  MMM, no oral lesions,  adequate dentition. Bilateral nares within normal limits. Throat without erythema, ulcerations or exudates. no Cough on exam, no hoarseness on exam. Eyes:Pupils Equal Round Reactive to light, Extraocular movements intact,  Conjunctiva without redness, discharge or icterus. Neck/lymp/endocrine: Supple,no lymphadenopathy, no thyromegaly CV: RRR no murmur, no edema, +2/4 P posterior tibialis pulses.  No JVD. Chest: CTAB, no wheeze, rhonchi or crackles. normal Respiratory effort. good Air movement. Abd: Soft. flat. NTND. BS present. no Masses palpated. No hepatosplenomegaly. No rebound tenderness or guarding. Skin: no rashes, purpura or petechiae. Warm and well-perfused. Skin  intact. Neuro/Msk:  Normal gait. PERLA. EOMi. Alert. Oriented x3.  Cranial nerves II through XII intact. Muscle strength 5/5 upper/lower extremity. DTRs equal bilaterally. Psych: Normal affect, dress and demeanor. Normal speech. Normal thought content and judgment. Breasts: breasts appear normal, symmetrical, no tenderness on exam, no suspicious masses, no skin or nipple changes or axillary nodes.  Assessment/plan: Cindy Thompson is a 30 y.o. female present for CPE without PAP.  Encounter for general adult medical examination with abnormal findings Patient was encouraged to exercise greater than 150 minutes a week. Patient was encouraged to choose a diet filled with fresh fruits and vegetables, and lean meats. AVS provided to patient today for education/recommendation on gender specific health and safety maintenance. - all health maintenance UTD.  - Flu vac yearly encouraged.   Screening for deficiency anemia Thyroid disorder screen Diabetes mellitus screening Encounter for lipid screening for cardiovascular disease Encounter for vitamin deficiency screening - CBC w/Diff - Comp Met (CMET) - TSH - Hemoglobin A1c - Lipid panel - Vitamin D (25 hydroxy)  Ear lesion - nonhealing ~30m lesion left inner ear > 1.5 years. Discussed treatment plan of topicals and discontinuation of q-tips. Consider length of time bleeding lesion present and pt desire, will refer to ENT. Considered Derm referral, however given location will try ENT 1st.  - Ambulatory referral to ENT  Return in about 1 year (around 05/21/2017) for CPE.  Electronically signed by: RHoward Pouch DO LRay

## 2016-05-22 ENCOUNTER — Encounter: Payer: Self-pay | Admitting: Family Medicine

## 2016-05-22 ENCOUNTER — Telehealth: Payer: Self-pay | Admitting: Family Medicine

## 2016-05-22 NOTE — Telephone Encounter (Signed)
Please call patient on labs are normal.

## 2016-05-22 NOTE — Telephone Encounter (Signed)
Spoke with patient reviewed lab results . Patient verbalized understanding. 

## 2016-06-05 DIAGNOSIS — L299 Pruritus, unspecified: Secondary | ICD-10-CM | POA: Diagnosis not present

## 2016-06-25 ENCOUNTER — Ambulatory Visit (INDEPENDENT_AMBULATORY_CARE_PROVIDER_SITE_OTHER): Payer: BLUE CROSS/BLUE SHIELD | Admitting: Family Medicine

## 2016-06-25 ENCOUNTER — Encounter: Payer: Self-pay | Admitting: Family Medicine

## 2016-06-25 VITALS — BP 100/68 | HR 67 | Temp 97.7°F | Resp 20 | Wt 111.8 lb

## 2016-06-25 DIAGNOSIS — M25562 Pain in left knee: Secondary | ICD-10-CM

## 2016-06-25 MED ORDER — MELOXICAM 15 MG PO TABS: 15.0000 mg | ORAL_TABLET | Freq: Every day | ORAL | 0 refills | Status: DC

## 2016-06-25 NOTE — Patient Instructions (Addendum)
Mobic daily with a meal for 4 weeks.  Caution on squatting, stairs, lifting.  Wear knee compression for stability. Folowup in 4 weeks or sooner if symptoms worsening.   Meniscus Tear A meniscus tear is a knee injury in which a piece of the meniscus is torn. The meniscus is a thick, rubbery, wedge-shaped cartilage in the knee. Two menisci are located in each knee. They sit between the upper bone (femur) and lower bone (tibia) that make up the knee joint. Each meniscus acts as a shock absorber for the knee. A torn meniscus is one of the most common types of knee injuries. This injury can range from mild to severe. Surgery may be needed for a severe tear. CAUSES This injury may be caused by any squatting, twisting, or pivoting movement. Sports-related injuries are the most common cause. These often occur from:  Running and stopping suddenly.  Changing direction.  Being tackled or knocked off your feet. As people get older, their meniscus gets thinner and weaker. In these people, tears can happen more easily, such as from climbing stairs.  RISK FACTORS This injury is more likely to happen to:  People who play contact sports.  Males.  People who are 68-14 years of age. SYMPTOMS  Symptoms of this injury include:  Knee pain, especially at the side of the knee joint. You may feel pain when the injury occurs, or you may only hear a pop and feel pain later.  A feeling that your knee is clicking, catching, locking, or giving way.  Not being able to fully bend or extend your knee.  Bruising or swelling in your knee. DIAGNOSIS  This injury may be diagnosed based on your symptoms and a physical exam. The physical exam may include:  Moving your knee in different ways.  Feeling for tenderness.  Listening for a clicking sound.  Checking if your knee locks or catches. You may also have tests, such as:  X-rays.  MRI.  A procedure to look inside your knee with a narrow surgical  telescope (arthroscopy). You may be referred to a knee specialist (orthopedic surgeon). TREATMENT  Treatment for this injury depends on the severity of the tear. Treatment for a mild tear may include:  Rest.  Medicine to reduce pain and swelling. This is usually a nonsteroidal anti-inflammatory drug (NSAID).  A knee brace or an elastic sleeve or wrap.  Using crutches or a walker to keep weight off your knee and to help you walk.  Exercises to strengthen your knee (physical therapy). You may need surgery if you have a severe tear or if other treatments are not working.  HOME CARE INSTRUCTIONS Managing Pain and Swelling  Take over-the-counter and prescription medicines only as told by your health care provider.  If directed, apply ice to the injured area:  Put ice in a plastic bag.  Place a towel between your skin and the bag.  Leave the ice on for 20 minutes, 2-3 times per day.  Raise (elevate) the injured area above the level of your heart while you are sitting or lying down. Activity  Do not use the injured limb to support your body weight until your health care provider says that you can. Use crutches or a walker as told by your health care provider.  Return to your normal activities as told by your health care provider. Ask your health care provider what activities are safe for you.  Perform range-of-motion exercises only as told by your health care provider.  Begin doing exercises to strengthen your knee and leg muscles only as told by your health care provider. After you recover, your health care provider may recommend these exercises to help prevent another injury. General Instructions  Use a knee brace or elastic wrap as told by your health care provider.  Keep all follow-up visits as told by your health care provider. This is important. SEEK MEDICAL CARE IF:  You have a fever.  Your knee becomes red, tender, or swollen.  Your pain medicine is not  helping.  Your symptoms get worse or do not improve after 2 weeks of home care.   This information is not intended to replace advice given to you by your health care provider. Make sure you discuss any questions you have with your health care provider.   Document Released: 12/08/2002 Document Revised: 06/08/2015 Document Reviewed: 01/10/2015 Elsevier Interactive Patient Education [employer]2016 Elsevier Inc.

## 2016-06-25 NOTE — Progress Notes (Signed)
    Cindy SenateLei Balint , 11-29-1985, 30 y.o., female MRN: [id number] Patient Care Team    Relationship Specialty Notifications Start End  Natalia Leatherwoodenee A Aven Christen, DO PCP - General Family Medicine  04/11/16    Pt seen with interpreter present today.   CC: Knee pain Subjective: Pt presents for an acute OV with complaints of left knee pain of 3 weeks duration. She had a fall/slip with internal rotation of knee. She her a pop sound when she slipped. She iced it immediately, she did not go to the doctor. She reports it hurt for the first two weeks with movement. Now pain with flexion pointe to medial meniscus.  She reports it is stiff and hurst to kneel down.  She has never had an injury to her knee prior.   No Known Allergies Social History  Substance Use Topics  . Smoking status: Never Smoker  . Smokeless tobacco: Never Used  . Alcohol use No   Past Medical History:  Diagnosis Date  . Allergy   . GERD (gastroesophageal reflux disease)   . Thyroid disease    thyroid nodules   History reviewed. No pertinent surgical history. Family History  Problem Relation Age of Onset  . Suicidality Father   . Ovarian cancer Paternal Grandmother      Medication List       Accurate as of 06/25/16  2:03 PM. Always use your most recent med list.          cetirizine 10 MG tablet Commonly known as:  ZYRTEC Take 1 tablet (10 mg total) by mouth daily.   hydrocortisone 2.5 % cream   hydrocortisone 2.5 % cream   levonorgestrel 20 MCG/24HR IUD Commonly known as:  MIRENA 1 each by Intrauterine route once.   mometasone 0.1 % lotion Commonly known as:  ELOCON Apply topically.       No results found for this or any previous visit (from the past 24 hour(s)). No results found.   ROS: Negative, with the exception of above mentioned in HPI   Objective:  BP 100/68 (BP Location: Right Arm, Patient Position: Sitting, Cuff Size: Normal)   Pulse 67   Temp 97.7 F (36.5 C)   Resp 20   Wt 111 lb 12.8 oz (50.7  kg)   SpO2 98%   BMI 22.58 kg/m  Body mass index is 22.58 kg/m. Gen: Afebrile. No acute distress. Nontoxic in appearance, well developed, well nourished.  HENT: AT. [place].  MMM Eyes:Pupils Equal Round Reactive to light, Extraocular movements intact,  Conjunctiva without redness, discharge or icterus. MSK: (left knee) No erythema, very small effusion. No soft tissue swelling. TTP medial joint line. Pain with [place].  Negative lachman's, no ligament instability.  Skin: No rashes, purpura or petechiae.  Neuro: Normal gait. PERLA. EOMi. Alert. Oriented x3   Assessment/Plan: Cindy Thompson is a 30 y.o. female present for acute OV for  Left knee pain - possibly medial meniscal injury of left knee. Dicussed NSAIDS and compression. Avoidance of lifting, squatting and caution with stairs.  - mobic 15 mg with meal - F/U 4 weeks, if worsening sooner. Consider ortho referral if worsening or no improvement.   > 25 minutes spent with patient, >50% of time spent face to face counseling patient and coordinating care.  electronically signed by:  Felix Pacinienee Brynlynn Walko, DO  [facility] [place]

## 2016-07-16 ENCOUNTER — Encounter: Payer: Self-pay | Admitting: Family Medicine

## 2016-07-16 ENCOUNTER — Ambulatory Visit (INDEPENDENT_AMBULATORY_CARE_PROVIDER_SITE_OTHER): Payer: BLUE CROSS/BLUE SHIELD | Admitting: Family Medicine

## 2016-07-16 VITALS — BP 99/64 | HR 66 | Temp 97.9°F | Resp 18 | Ht 59.0 in | Wt 112.2 lb

## 2016-07-16 DIAGNOSIS — M25562 Pain in left knee: Secondary | ICD-10-CM | POA: Diagnosis not present

## 2016-07-16 MED ORDER — MELOXICAM 15 MG PO TABS: 15.0000 mg | ORAL_TABLET | Freq: Every day | ORAL | 5 refills | Status: DC

## 2016-07-16 NOTE — Patient Instructions (Signed)
I am glad you are feeling better.  I have called in refills of the mobic you can use once a day as needed.

## 2016-07-16 NOTE — Progress Notes (Signed)
Cindy Thompson , 1986-08-11, 30 y.o., female MRN: [id number] Patient Care Team    Relationship Specialty Notifications Start End  Cindy Leatherwoodenee A Kuneff, DO PCP - General Family Medicine  04/11/16    Pt seen with interpreter present today.   CC: Knee pain Subjective:   Left knee pain: Pt states her knee pain is greatly improved. She has been using the mobic once a day and now only feels pain with deep squatting. She denies swelling or erythema. She has bought new shoes as well to help her not slip on the floors at work.  Prior note:  Pt presents for an acute OV with complaints of left knee pain of 3 weeks duration. She had a fall/slip with internal rotation of knee. She her a pop sound when she slipped. She iced it immediately, she did not go to the doctor. She reports it hurt for the first two weeks with movement. Now pain with flexion pointe to medial meniscus.  She reports it is stiff and hurst to kneel down.  She has never had an injury to her knee prior.   No Known Allergies Social History  Substance Use Topics  . Smoking status: Never Smoker  . Smokeless tobacco: Never Used  . Alcohol use No   Past Medical History:  Diagnosis Date  . Allergy   . GERD (gastroesophageal reflux disease)   . Thyroid disease    thyroid nodules   No past surgical history on file. Family History  Problem Relation Age of Onset  . Suicidality Father   . Ovarian cancer Paternal Grandmother      Medication List       Accurate as of 07/16/16  2:54 PM. Always use your most recent med list.          cetirizine 10 MG tablet Commonly known as:  ZYRTEC Take 1 tablet (10 mg total) by mouth daily.   hydrocortisone 2.5 % cream   levonorgestrel 20 MCG/24HR IUD Commonly known as:  MIRENA 1 each by Intrauterine route once.   meloxicam 15 MG tablet Commonly known as:  MOBIC Take 1 tablet (15 mg total) by mouth daily.   mometasone 0.1 % lotion Commonly known as:  ELOCON Apply topically.       No  results found for this or any previous visit (from the past 24 hour(s)). No results found.   ROS: Negative, with the exception of above mentioned in HPI   Objective:  BP 99/64 (BP Location: Left Arm, Patient Position: Sitting, Cuff Size: Normal)   Pulse 66   Temp 97.9 F (36.6 C)   Resp 18   Ht 4\' 11"  (1.499 m)   Wt 112 lb 4 oz (50.9 kg)   SpO2 98%   BMI 22.67 kg/m  Body mass index is 22.67 kg/m. Gen: Afebrile. No acute distress. Nontoxic in appearance, well developed, well nourished.  HENT: AT. [place].  MMM Eyes:Pupils Equal Round Reactive to light, Extraocular movements intact,  Conjunctiva without redness, discharge or icterus. MSK: (left knee) No erythema,  No soft tissue swelling. NoTTP medial joint line. NV intact distally.  Skin: No rashes, purpura or petechiae.  Neuro: Normal gait. PERLA. EOMi. Alert. Oriented x3   Assessment/Plan: Cindy Thompson is a 30 y.o. female present for acute OV for  Left knee pain - greatly improved. She can use mobic QD PRN with food, refills provided today.  - She is to use caution with deep squatting.  - F/u PRN   electronically signed  by:  Howard Pouch, DO  [place]

## 2016-08-17 ENCOUNTER — Ambulatory Visit (INDEPENDENT_AMBULATORY_CARE_PROVIDER_SITE_OTHER): Payer: BLUE CROSS/BLUE SHIELD | Admitting: Family Medicine

## 2016-08-17 ENCOUNTER — Encounter: Payer: Self-pay | Admitting: Family Medicine

## 2016-08-17 VITALS — BP 139/71 | HR 73 | Temp 97.9°F | Resp 18 | Ht 59.0 in | Wt 110.0 lb

## 2016-08-17 DIAGNOSIS — R1011 Right upper quadrant pain: Secondary | ICD-10-CM

## 2016-08-17 DIAGNOSIS — R112 Nausea with vomiting, unspecified: Secondary | ICD-10-CM | POA: Diagnosis not present

## 2016-08-17 LAB — CBC WITH DIFFERENTIAL/PLATELET
BASOS ABS: 0 10*3/uL (ref 0.0–0.1)
Basophils Relative: 0.6 % (ref 0.0–3.0)
Eosinophils Absolute: 0.1 10*3/uL (ref 0.0–0.7)
Eosinophils Relative: 1.1 % (ref 0.0–5.0)
HEMATOCRIT: 39.1 % (ref 36.0–46.0)
Hemoglobin: 13.4 g/dL (ref 12.0–15.0)
LYMPHS PCT: 40 % (ref 12.0–46.0)
Lymphs Abs: 2.7 10*3/uL (ref 0.7–4.0)
MCHC: 34.2 g/dL (ref 30.0–36.0)
MCV: 93.6 fl (ref 78.0–100.0)
MONOS PCT: 5.7 % (ref 3.0–12.0)
Monocytes Absolute: 0.4 10*3/uL (ref 0.1–1.0)
NEUTROS PCT: 52.6 % (ref 43.0–77.0)
Neutro Abs: 3.5 10*3/uL (ref 1.4–7.7)
Platelets: 212 10*3/uL (ref 150.0–400.0)
RBC: 4.18 Mil/uL (ref 3.87–5.11)
RDW: 11.9 % (ref 11.5–15.5)
WBC: 6.7 10*3/uL (ref 4.0–10.5)

## 2016-08-17 LAB — H. PYLORI ANTIBODY, IGG: H PYLORI IGG: NEGATIVE

## 2016-08-17 LAB — COMPREHENSIVE METABOLIC PANEL
ALBUMIN: 5 g/dL (ref 3.5–5.2)
ALT: 10 U/L (ref 0–35)
AST: 13 U/L (ref 0–37)
Alkaline Phosphatase: 41 U/L (ref 39–117)
BUN: 12 mg/dL (ref 6–23)
CHLORIDE: 102 meq/L (ref 96–112)
CO2: 30 meq/L (ref 19–32)
Calcium: 9.8 mg/dL (ref 8.4–10.5)
Creatinine, Ser: 0.64 mg/dL (ref 0.40–1.20)
GFR: 113.93 mL/min (ref >60.00)
Glucose, Bld: 83 mg/dL (ref 70–99)
POTASSIUM: 4.2 meq/L (ref 3.5–5.1)
SODIUM: 140 meq/L (ref 135–145)
Total Bilirubin: 0.9 mg/dL (ref 0.2–1.2)
Total Protein: 7.6 g/dL (ref 6.0–8.3)

## 2016-08-17 LAB — POCT URINE PREGNANCY: PREG TEST UR: NEGATIVE

## 2016-08-17 LAB — C-REACTIVE PROTEIN

## 2016-08-17 MED ORDER — ONDANSETRON HCL 4 MG PO TABS: 4.0000 mg | ORAL_TABLET | Freq: Three times a day (TID) | ORAL | 0 refills | Status: DC | PRN

## 2016-08-17 NOTE — Patient Instructions (Signed)
Bland diet, soft and no spicy.  Use zofran for nausea up to every 8 hours only. I will call you with results once available.  Ultrasound ordered today and they will call to schedule you.  Use stool softener daily after diarrhea resolves.     Cholecystitis Introduction Cholecystitis is swelling and irritation (inflammation) of the gallbladder. The gallbladder is an organ that is shaped like a pear. It is under the liver on the right side of the body. This condition is often caused by gallstones. You doctor may do tests to see how your gallbladder works. These tests may include:  Imaging tests, such as:  An ultrasound.  MRI.  Tests that check how your liver works. This condition needs treatment. Follow these instructions at home: Home care will depend on your treatment. In general:  Take over-the-counter and prescription medicines only as told by your doctor.  If you were prescribed an antibiotic medicine, take it as told by your doctor. Do not stop taking the antibiotic even if you start to feel better.  Follow instructions from your doctor about what to eat or drink. When you are allowed to eat, avoid eating or drinking anything that causes your symptoms to start.  Keep all follow-up visits as told by your doctor. This is important. Contact a doctor if:  You have pain and your medicine does not help.  You have a fever. Get help right away if:  Your pain moves to:  Another part of your belly (abdomen).  Your back.  Your symptoms do not go away.  You have new symptoms. This information is not intended to replace advice given to you by your health care provider. Make sure you discuss any questions you have with your health care provider. Document Released: 09/06/2011 Document Revised: 02/23/2016 Document Reviewed: 12/29/2014  2017 Elsevier

## 2016-08-17 NOTE — Progress Notes (Signed)
Pre visit review using our clinic review tool, if applicable. No additional management support is needed unless otherwise documented below in the visit note. 

## 2016-08-17 NOTE — Progress Notes (Signed)
Cindy Thompson , 17-Jul-1986, 30 y.o., female MRN: [id number] Patient Care Team    Relationship Specialty Notifications Start End  Ma Hillock, DO PCP - General Family Medicine  04/11/16    *pt is present with interpreter today.   CC: abd pain Subjective: Pt presents for an acute OV with complaints of nausea of x 2 weeks duration. The nausea she experiences is before eating.She denies vomit, although she feels she has to vomit. She reports the discomfort/pain is worse after eating and describes it as feeling bloated and uncomfortable. She endorses sharp pain in her RUQ intermittently. The discomfort does not vary on the amount or content of of her food. She seems to tolerate softer foods better than solid foods.  She has lost 2 lbs. She endorses watery diarrhea that started 2 days ago, but thinks this is from eating old cheesecake. She does not have intolerance to diary and eats it a good bit. She suffers from frequent constipation, but does not take a stool softener daily, because she forgets. Her watery stools have decreased from 5-6x a day to 1 loose stool today. She denies fever, chills, fatigue or rash. She has a mirena IUD, and her last LMP was about 2 weeks ago. 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 She denies breast tenderness. She states she took an OTC nexium and it helped part of discomfort but not all.   No Known Allergies Social History  Substance Use Topics  . Smoking status: Never Smoker  . Smokeless tobacco: Never Used  . Alcohol use No   Past Medical History:  Diagnosis Date  . Allergy   . GERD (gastroesophageal reflux disease)   . Thyroid disease    thyroid nodules   No past surgical history on file. Family History  Problem Relation Age of Onset  . Suicidality Father   . Ovarian cancer Paternal Grandmother      Medication List       Accurate as of 08/17/16  1:32 PM. Always use your most recent med list.          cetirizine 10 MG tablet Commonly known as:   ZYRTEC Take 1 tablet (10 mg total) by mouth daily.   hydrocortisone 2.5 % cream   levonorgestrel 20 MCG/24HR IUD Commonly known as:  MIRENA 1 each by Intrauterine route once.   meloxicam 15 MG tablet Commonly known as:  MOBIC Take 1 tablet (15 mg total) by mouth daily.   mometasone 0.1 % lotion Commonly known as:  ELOCON Apply topically.       No results found for this or any previous visit (from the past 24 hour(s)). No results found.   ROS: Negative, with the exception of above mentioned in HPI  Objective:  BP 139/71 (BP Location: Right Arm, Patient Position: Sitting, Cuff Size: Normal)   Pulse 73   Temp 97.9 F (36.6 C) (Oral)   Resp 18   Ht '4\' 11"'  (1.499 m)   Wt 110 lb (49.9 kg)   SpO2 97%   BMI 22.22 kg/m  Body mass index is 22.22 kg/m. Gen: Afebrile. No acute distress. Nontoxic in appearance, well developed, well nourished. [place] female.  HENT: AT. [place].  MMM, no oral lesions.  Eyes:Pupils Equal Round Reactive to light, Extraocular movements intact,  Conjunctiva without redness, discharge or icterus. Neck/lymp/endocrine: Supple,no lymphadenopathy CV: RRR Chest: CTAB, no wheeze or crackles. Good air movement, normal resp effort.  Abd: Soft. Flat. ND. Mild tenderness RUQ/RLQ.  BS present. no  Masses palpated. No rebound or guarding.  Skin: no rashes, purpura or petechiae.  Neuro:  Normal gait. PERLA. EOMi. Alert. Oriented x3   Assessment/Plan: Ski Polich is a 30 y.o. female present for acute OV for  Nausea and vomiting, intractability of vomiting not specified, unspecified vomiting type  Right upper quadrant abdominal pain - DDX: GERD, PUD/H.pylori, cholecystitis. choledocholithiasis  - POCT urine pregnancy--> negative - US Abdomen Complete; Future - CBC w/Diff - Comp Met (CMET) - C-reactive protein - H. pylori antibody, IgG - zofran prn tid - continue OTC nexium.  - Daily stool softener after acute illness for constipation  - F/U dependent on resutls.    electronically signed by:  Howard Pouch, DO  [place]

## 2016-08-20 ENCOUNTER — Telehealth: Payer: Self-pay | Admitting: Family Medicine

## 2016-08-20 ENCOUNTER — Ambulatory Visit: Payer: BLUE CROSS/BLUE SHIELD | Admitting: Family Medicine

## 2016-08-20 NOTE — Telephone Encounter (Signed)
Please call pt: - her labs are all normal. I had ordered an US of her ABD, this should be scheduled for her soon and we will call after we get the results.

## 2016-08-20 NOTE — Telephone Encounter (Signed)
Spoke with patient reviewed lab results. 

## 2016-08-21 ENCOUNTER — Ambulatory Visit (HOSPITAL_BASED_OUTPATIENT_CLINIC_OR_DEPARTMENT_OTHER)
Admission: RE | Admit: 2016-08-21 | Discharge: 2016-08-21 | Disposition: A | Discharge status: Home / Self Care | Payer: BLUE CROSS/BLUE SHIELD | Source: Ambulatory Visit | Attending: Family Medicine | Admitting: Family Medicine

## 2016-08-21 ENCOUNTER — Telehealth: Payer: Self-pay | Admitting: Family Medicine

## 2016-08-21 DIAGNOSIS — R112 Nausea with vomiting, unspecified: Secondary | ICD-10-CM | POA: Insufficient documentation

## 2016-08-21 DIAGNOSIS — R1011 Right upper quadrant pain: Secondary | ICD-10-CM | POA: Diagnosis not present

## 2016-08-21 MED ORDER — OMEPRAZOLE 40 MG PO CPDR: 40.0000 mg | DELAYED_RELEASE_CAPSULE | Freq: Every day | ORAL | 3 refills | Status: DC

## 2016-08-21 MED ORDER — ALIGN 4 MG PO CAPS: 1.0000 | ORAL_CAPSULE | Freq: Every day | ORAL | 1 refills | Status: DC

## 2016-08-21 NOTE — Telephone Encounter (Signed)
Please call  Pt: - her US is normal as well.   - I have called in a probiotic I want her to take daily for 8 weeks, along with omeprazole higher dose.  - She is to take a daily OTC stool softener as well.  - F/U 8 weeks, sooner if worsening.

## 2016-08-22 NOTE — Telephone Encounter (Signed)
Patient notified and patient verbalized understanding. Patient will call back to schedule appointment.

## 2016-08-27 ENCOUNTER — Encounter: Payer: Self-pay | Admitting: Family Medicine

## 2016-08-27 ENCOUNTER — Telehealth: Payer: Self-pay | Admitting: Family Medicine

## 2016-08-27 ENCOUNTER — Ambulatory Visit (INDEPENDENT_AMBULATORY_CARE_PROVIDER_SITE_OTHER): Payer: BLUE CROSS/BLUE SHIELD | Admitting: Family Medicine

## 2016-08-27 ENCOUNTER — Ambulatory Visit: Payer: BLUE CROSS/BLUE SHIELD | Admitting: Family Medicine

## 2016-08-27 VITALS — BP 108/73 | HR 68 | Temp 98.0°F | Resp 20 | Wt 108.5 lb

## 2016-08-27 DIAGNOSIS — K5900 Constipation, unspecified: Secondary | ICD-10-CM

## 2016-08-27 MED ORDER — ALIGN 4 MG PO CAPS: 1.0000 | ORAL_CAPSULE | Freq: Every day | ORAL | 1 refills | Status: DC

## 2016-08-27 MED ORDER — POLYETHYLENE GLYCOL 3350 17 GM/SCOOP PO POWD: 17.0000 g | Freq: Two times a day (BID) | ORAL | 0 refills | Status: DC | PRN

## 2016-08-27 NOTE — Telephone Encounter (Signed)
Patient is feeling very full and feels like her stomach is swelling when she eats. Her abdominal pain is in the upper region. Patient is requesting medication.

## 2016-08-27 NOTE — Telephone Encounter (Signed)
Patient has been scheduled for today.

## 2016-08-27 NOTE — Telephone Encounter (Signed)
Dr. Kuneff pt.  

## 2016-08-27 NOTE — Patient Instructions (Addendum)
Drink at least 60-80 ounces of water a day.  Start Mirlax in 8 ounces of water daily (powder-packet) until you are going to the bathroom daily and it is soft.  Start Align daily, this is a probiotic you will take once a day for 8 weeks. This may be over the counter, ask your pharmacist, but I have called it in.  Continue the omeprazole everyday as well.    You need to take 30 minutes everyday and sit on the toilet until you have a bowel movement. Try to this at the same time everyday.    Constipation, Adult Constipation is when a person:  Poops (has a bowel movement) fewer times in a week than normal.  Has a hard time pooping.  Has poop that is dry, hard, or bigger than normal. Follow these instructions at home: Eating and drinking  Eat foods that have a lot of fiber, such as:  Fresh fruits and vegetables.  Whole grains.  Beans.  Eat less of foods that are high in fat, low in fiber, or overly processed, such as:  [place] fries.  Hamburgers.  Cookies.  Candy.  Soda.  Drink enough fluid to keep your pee (urine) clear or pale yellow. General instructions  Exercise regularly or as told by your doctor.  Go to the restroom when you feel like you need to poop. Do not hold it in.  Take over-the-counter and prescription medicines only as told by your doctor. These include any fiber supplements.  Do pelvic floor retraining exercises, such as:  Doing deep breathing while relaxing your lower belly (abdomen).  Relaxing your pelvic floor while pooping.  Watch your condition for any changes.  Keep all follow-up visits as told by your doctor. This is important. Contact a doctor if:  You have pain that gets worse.  You have a fever.  You have not pooped for 4 days.  You throw up (vomit).  You are not hungry.  You lose weight.  You are bleeding from the anus.  You have thin, pencil-like poop (stool). Get help right away if:  You have a fever, and your  symptoms suddenly get worse.  You leak poop or have blood in your poop.  Your belly feels hard or bigger than normal (is bloated).  You have very bad belly pain.  You feel dizzy or you faint. This information is not intended to replace advice given to you by your health care provider. Make sure you discuss any questions you have with your health care provider. Document Released: 03/05/2008 Document Revised: 04/06/2016 Document Reviewed: 03/07/2016 Elsevier Interactive Patient Education  2017 [employer]

## 2016-08-27 NOTE — Progress Notes (Signed)
Cindy Thompson , 12-10-1985, 30 y.o., female MRN: [id number] Patient Care Team    Relationship Specialty Notifications Start End  [name], DO PCP - General Family Medicine  04/11/16    *pt is present with interpreter again today.   CC: abd pain Subjective:  Pt presents for abd bloating. She states her upper epigastric pain has improved with omeprazole use. She has still had lower abd bloating and discomfort. She is taking an OTC stool softener twice, pt is uncertain of name but states it is a solution. This has helped her have one hard BM yesterday and then again today she had another small BM that was a little bit softener. She endorses long standing h/o of constipation and GERD, even as a child. She does not drink much water and she admits to ignoring sensation to have BM because she is too busy.  Prior note:  Pt presents for an acute OV with complaints of nausea of x 2 weeks duration. The nausea she experiences is before eating.She denies vomit, although she feels she has to vomit. She reports the discomfort/pain is worse after eating and describes it as feeling bloated and uncomfortable. She endorses sharp pain in her RUQ intermittently. The discomfort does not vary on the amount or content of of her food. She seems to tolerate softer foods better than solid foods.  She has lost 2 lbs. She endorses watery diarrhea that started 2 days ago, but thinks this is from eating old cheesecake. She does not have intolerance to diary and eats it a good bit. She suffers from frequent constipation, but does not take a stool softener daily, because she forgets. Her watery stools have decreased from 5-6x a day to 1 loose stool today. She denies fever, chills, fatigue or rash. She has a mirena IUD, and her last LMP was about 2 weeks ago. 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 She denies breast tenderness. She states she took an OTC nexium and it helped part of discomfort but not all.   No Known Allergies Social  History  Substance Use Topics  . Smoking status: Never Smoker  . Smokeless tobacco: Never Used  . Alcohol use No   Past Medical History:  Diagnosis Date  . Allergy   . GERD (gastroesophageal reflux disease)   . Thyroid disease    thyroid nodules   No past surgical history on file. Family History  Problem Relation Age of Onset  . Suicidality Father   . Ovarian cancer Paternal Grandmother      Medication List       Accurate as of 08/27/16  4:03 PM. Always use your most recent med list.          ALIGN 4 MG Caps Take 1 capsule (4 mg total) by mouth daily.   cetirizine 10 MG tablet Commonly known as:  ZYRTEC Take 1 tablet (10 mg total) by mouth daily.   levonorgestrel 20 MCG/24HR IUD Commonly known as:  MIRENA 1 each by Intrauterine route once.   mometasone 0.1 % lotion Commonly known as:  ELOCON Apply topically.   omeprazole 40 MG capsule Commonly known as:  PRILOSEC Take 1 capsule (40 mg total) by mouth daily.   ondansetron 4 MG tablet Commonly known as:  ZOFRAN Take 1 tablet (4 mg total) by mouth every 8 (eight) hours as needed for nausea or vomiting.       No results found for this or any previous visit (from the past 24 hour(s)).  No results found.   ROS: Negative, with the exception of above mentioned in HPI  Objective:  BP 108/73 (BP Location: Right Arm, Patient Position: Sitting, Cuff Size: Normal)   Pulse 68   Temp 98 F (36.7 C)   Resp 20   Wt 108 lb 8 oz (49.2 kg)   SpO2 98%   BMI 21.91 kg/m  Body mass index is 21.91 kg/m. Gen: Afebrile. No acute distress. Nontoxic in appearance, well developed, well nourished. Cong[place] female.  HENT: AT. [place].  MMM, no oral lesions.  Eyes:Pupils Equal Round Reactive to light, Extraocular movements intact,  Conjunctiva without redness, discharge or icterus. Chest: CTAB, no wheeze or crackles. Good air movement, normal resp effort.  Abd: Soft. Flat NT. Very mild distention. Moderate stool burden palpated.  BS present.  Skin: no rashes, purpura or petechiae.  Neuro:  Normal gait. PERLA. EOMi. Alert. Oriented x3   Assessment/Plan: Cindy Thompson is a 30 y.o. female present for acute OV for  Constipation, unspecified constipation type - If not resolved would consider CT abdomen, fhx ovarian cancer. Pts symptoms are improved with BM so more than likely constipation.  - Continue omeprazole, align and start miralax daily.  - 60- 80 ounces of water daily.  - behavior training, 30 minutes daily of sitting on the toilet, same time daily if able.  - f/u 2 weeks, may need to consider small amount of miralax +/- senna daily.   electronically signed by:  Felix Pacinienee Kuneff, DO  [facility] [place]

## 2016-08-27 NOTE — Telephone Encounter (Signed)
Patient is currently on medication . Last office visit she was instructed to make a follow up appt if getting worse. Please call patient and schedule an appt.

## 2016-08-28 ENCOUNTER — Telehealth: Payer: Self-pay | Admitting: Family Medicine

## 2016-08-28 MED ORDER — ALIGN 4 MG PO CAPS: 1.0000 | ORAL_CAPSULE | Freq: Every day | ORAL | 1 refills | Status: DC

## 2016-08-28 NOTE — Telephone Encounter (Signed)
Patient states only Miralax was at the pharmacy. The pharmacy said to contact our office if there was a second medication. Patient would like to pick up medication this afternoon if possible.

## 2016-08-28 NOTE — Telephone Encounter (Signed)
Rx sent 

## 2016-08-30 ENCOUNTER — Other Ambulatory Visit: Payer: BLUE CROSS/BLUE SHIELD

## 2016-09-10 ENCOUNTER — Ambulatory Visit: Payer: BLUE CROSS/BLUE SHIELD | Admitting: Family Medicine

## 2016-09-26 ENCOUNTER — Encounter: Payer: Self-pay | Admitting: Family Medicine

## 2016-09-26 ENCOUNTER — Ambulatory Visit (INDEPENDENT_AMBULATORY_CARE_PROVIDER_SITE_OTHER): Payer: BLUE CROSS/BLUE SHIELD | Admitting: Family Medicine

## 2016-09-26 VITALS — BP 99/66 | HR 66 | Temp 97.8°F | Resp 20 | Ht 59.0 in | Wt 105.5 lb

## 2016-09-26 DIAGNOSIS — K5904 Chronic idiopathic constipation: Secondary | ICD-10-CM | POA: Diagnosis not present

## 2016-09-26 DIAGNOSIS — R1011 Right upper quadrant pain: Secondary | ICD-10-CM | POA: Diagnosis not present

## 2016-09-26 MED ORDER — POLYETHYLENE GLYCOL 3350 17 GM/SCOOP PO POWD: 17.0000 g | Freq: Every day | ORAL | 11 refills | Status: DC

## 2016-09-26 NOTE — Patient Instructions (Signed)
I have called in miralax for you to use daily.  Continue omeprazole.  Continue align.  Watch diet and give it time to heal with use of medicines.  Followup in 8 weeks.

## 2016-09-26 NOTE — Progress Notes (Signed)
Cindy Thompson , [birth year], 30 y.o., female MRN: [id number] Patient Care Team    Relationship Specialty Notifications Start End  Natalia Leatherwoodenee A Panayiota Larkin, DO PCP - General Family Medicine  04/11/16    *pt is present with interpreter again today 09/26/2016  CC: abd pain followup  Subjective:  Constipation/ABD pain: pt states with the use of her miralax daily she has been able to have a regular BM daily. She states some days she had 2-3 BM daily. She denies diarrhea or watery stools. She feels her epigastric pain has improved with use of omeprazole. She is eating softer bland diet which has also helped. She started taking amoxicillin from [facility], bc she thought it would help.  Her bloating and nausea has resolved. She endorses long standing h/o of constipation and GERD, even as a child. She does not drink much water and she admits to ignoring sensation to have BM because she is too busy.  Prior note:  Pt presents for an acute OV with complaints of nausea of x 2 weeks duration. The nausea she experiences is before eating.She denies vomit, although she feels she has to vomit. She reports the discomfort/pain is worse after eating and describes it as feeling bloated and uncomfortable. She endorses sharp pain in her RUQ intermittently. The discomfort does not vary on the amount or content of of her food. She seems to tolerate softer foods better than solid foods.  She has lost 2 lbs. She endorses watery diarrhea that started 2 days ago, but thinks this is from eating old cheesecake. She does not have intolerance to diary and eats it a good bit. She suffers from frequent constipation, but does not take a stool softener daily, because she forgets. Her watery stools have decreased from 5-6x a day to 1 loose stool today. She denies fever, chills, fatigue or rash. She has a mirena IUD, and her last LMP was about 2 weeks ago. 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 She denies breast tenderness. She states she took an OTC nexium and  it helped part of discomfort but not all.   No Known Allergies Social History  Substance Use Topics  . Smoking status: Never Smoker  . Smokeless tobacco: Never Used  . Alcohol use No   Past Medical History:  Diagnosis Date  . Allergy   . GERD (gastroesophageal reflux disease)   . Thyroid disease    thyroid nodules   History reviewed. No pertinent surgical history. Family History  Problem Relation Age of Onset  . Suicidality Father   . Ovarian cancer Paternal Grandmother    Allergies as of 09/26/2016   No Known Allergies     Medication List       Accurate as of 09/26/16  1:16 PM. Always use your most recent med list.          ALIGN 4 MG Caps Take 1 capsule (4 mg total) by mouth daily.   amoxicillin 500 MG tablet Commonly known as:  AMOXIL Take 500 mg by mouth 2 (two) times daily.   cetirizine 10 MG tablet Commonly known as:  ZYRTEC Take 1 tablet (10 mg total) by mouth daily.   levonorgestrel 20 MCG/24HR IUD Commonly known as:  MIRENA 1 each by Intrauterine route once.   mometasone 0.1 % lotion Commonly known as:  ELOCON Apply topically.   omeprazole 40 MG capsule Commonly known as:  PRILOSEC Take 1 capsule (40 mg total) by mouth daily.   ondansetron 4 MG tablet Commonly known  as:  ZOFRAN Take 1 tablet (4 mg total) by mouth every 8 (eight) hours as needed for nausea or vomiting.   polyethylene glycol powder powder Commonly known as:  GLYCOLAX/MIRALAX Take 17 g by mouth 2 (two) times daily as needed.       No results found for this or any previous visit (from the past 24 hour(s)). No results found.   ROS: Negative, with the exception of above mentioned in HPI  Objective:  BP 99/66 (BP Location: Right Arm, Patient Position: Sitting, Cuff Size: Normal)   Pulse 66   Temp 97.8 F (36.6 C)   Resp 20   Ht 4\' 11"  (1.499 m)   Wt 105 lb 8 oz (47.9 kg)   SpO2 98%   BMI 21.31 kg/m  Body mass index is 21.31 kg/m.  Gen: Afebrile. No acute  distress. Pleasant, Asian female. HENT: AT. [place]. B MMM.  Eyes:Pupils Equal Round Reactive to light, Extraocular movements intact,  Conjunctiva without redness, discharge or icterus. CV: RRR Abd: Soft. Flat. NTND. BS present. No Masses palpated.    Assessment/Plan: Cindy SenateLei Keizer is a 30 y.o. female present for follow-up OV for  Right upper quadrant abdominal pain/Chronic idiopathic constipation -Reviewed all labs and CT with patient again today. It sounds as if she is having good therapeutic response to miralax. - Continue align, continue omeprazole, continue Miralax - Discontinue the over-the-counter amoxicillin she received from [facility]. Discussed with her again today that she did not have H pylori infection, and that would be the only reason she would ever need that medication for her stomach. - Continue drinking 60-80 ounces of water today, with daily behavior training allowing time to have daily bowel movement. -Follow-up in 8 weeks after a full 12 week course of omeprazole. If patient's condition is worsening, she is to be seen sooner and would refer to GI.  > 25 minutes spent with patient, >50% of time spent face to face counseling   electronically signed by:  Felix Pacinienee Elanah Osmanovic, DO  [facility] [place]

## 2016-10-16 DIAGNOSIS — J069 Acute upper respiratory infection, unspecified: Secondary | ICD-10-CM | POA: Diagnosis not present

## 2016-10-16 DIAGNOSIS — J40 Bronchitis, not specified as acute or chronic: Secondary | ICD-10-CM | POA: Diagnosis not present

## 2016-11-05 ENCOUNTER — Other Ambulatory Visit: Payer: BLUE CROSS/BLUE SHIELD | Admitting: Women's Health

## 2016-11-12 ENCOUNTER — Ambulatory Visit: Payer: BLUE CROSS/BLUE SHIELD | Admitting: Family Medicine

## 2016-11-15 ENCOUNTER — Other Ambulatory Visit: Payer: BLUE CROSS/BLUE SHIELD | Admitting: Advanced Practice Midwife

## 2016-11-19 ENCOUNTER — Ambulatory Visit: Payer: BLUE CROSS/BLUE SHIELD | Admitting: Family Medicine

## 2016-11-26 ENCOUNTER — Ambulatory Visit (INDEPENDENT_AMBULATORY_CARE_PROVIDER_SITE_OTHER): Payer: BLUE CROSS/BLUE SHIELD | Admitting: Family Medicine

## 2016-11-26 ENCOUNTER — Encounter: Payer: Self-pay | Admitting: Family Medicine

## 2016-11-26 VITALS — BP 103/70 | HR 65 | Temp 97.7°F | Resp 20 | Ht 59.0 in | Wt 106.8 lb

## 2016-11-26 DIAGNOSIS — J301 Allergic rhinitis due to pollen: Secondary | ICD-10-CM | POA: Insufficient documentation

## 2016-11-26 DIAGNOSIS — K219 Gastro-esophageal reflux disease without esophagitis: Secondary | ICD-10-CM

## 2016-11-26 DIAGNOSIS — K5904 Chronic idiopathic constipation: Secondary | ICD-10-CM

## 2016-11-26 MED ORDER — OMEPRAZOLE 20 MG PO CPDR: 20.0000 mg | DELAYED_RELEASE_CAPSULE | Freq: Every day | ORAL | 3 refills | Status: DC

## 2016-11-26 MED ORDER — FEXOFENADINE HCL 180 MG PO TABS: 180.0000 mg | ORAL_TABLET | Freq: Every day | ORAL | 1 refills | Status: DC

## 2016-11-26 NOTE — Progress Notes (Signed)
Cindy Thompson , 1986-07-09, 31 y.o., female MRN: [id number] Patient Care Team    Relationship Specialty Notifications Start End  Natalia Leatherwoodenee A Odaly Peri, DO PCP - General Family Medicine  04/11/16    *pt is present with interpreter again today 11/26/2016  CC: abd pain followup  Subjective:  Constipation/ABD pain: Pt states she is doing much better. Her stomach does not hurt and her constipation has completely resolved. She is still using the align probiotic and miralax as needed only. She has stopped the omeprazole 40 mg after completing 12 weeks. She does feel some of her symptoms returned after stopping medicine, but not nearly as bad.   Prior note:  pt states with the use of her miralax daily she has been able to have a regular BM daily. She states some days she had 2-3 BM daily. She denies diarrhea or watery stools. She feels her epigastric pain has improved with use of omeprazole. She is eating softer bland diet which has also helped. She started taking amoxicillin from [facility], bc she thought it would help.  Her bloating and nausea has resolved. She endorses long standing h/o of constipation and GERD, even as a child. She does not drink much water and she admits to ignoring sensation to have BM because she is too busy.  Prior note:  Pt presents for an acute OV with complaints of nausea of x 2 weeks duration. The nausea she experiences is before eating.She denies vomit, although she feels she has to vomit. She reports the discomfort/pain is worse after eating and describes it as feeling bloated and uncomfortable. She endorses sharp pain in her RUQ intermittently. The discomfort does not vary on the amount or content of of her food. She seems to tolerate softer foods better than solid foods.  She has lost 2 lbs. She endorses watery diarrhea that started 2 days ago, but thinks this is from eating old cheesecake. She does not have intolerance to diary and eats it a good bit. She suffers from frequent  constipation, but does not take a stool softener daily, because she forgets. Her watery stools have decreased from 5-6x a day to 1 loose stool today. She denies fever, chills, fatigue or rash. She has a mirena IUD, and her last LMP was about 2 weeks ago. 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 She denies breast tenderness. She states she took an OTC nexium and it helped part of discomfort but not all.   No Known Allergies Social History  Substance Use Topics  . Smoking status: Never Smoker  . Smokeless tobacco: Never Used  . Alcohol use No   Past Medical History:  Diagnosis Date  . Allergy   . GERD (gastroesophageal reflux disease)   . Thyroid disease    thyroid nodules   History reviewed. No pertinent surgical history. Family History  Problem Relation Age of Onset  . Suicidality Father   . Ovarian cancer Paternal Grandmother    Allergies as of 11/26/2016   No Known Allergies     Medication List       Accurate as of 11/26/16  2:05 PM. Always use your most recent med list.          ALIGN 4 MG Caps Take 1 capsule (4 mg total) by mouth daily.   cetirizine 10 MG tablet Commonly known as:  ZYRTEC Take 1 tablet (10 mg total) by mouth daily.   levonorgestrel 20 MCG/24HR IUD Commonly known as:  MIRENA 1 each by Intrauterine route  once.   mometasone 0.1 % lotion Commonly known as:  ELOCON Apply topically.   omeprazole 40 MG capsule Commonly known as:  PRILOSEC Take 1 capsule (40 mg total) by mouth daily.   polyethylene glycol powder powder Commonly known as:  GLYCOLAX/MIRALAX Take 17 g by mouth daily.       No results found for this or any previous visit (from the past 24 hour(s)). No results found.   ROS: Negative, with the exception of above mentioned in HPI  Objective:  BP 103/70 (BP Location: Left Arm, Patient Position: Sitting, Cuff Size: Normal)   Pulse 65   Temp 97.7 F (36.5 C)   Resp 20   Ht 4\' 11"  (1.499 m)   Wt 106 lb 12 oz (48.4 kg)   SpO2 98%    BMI 21.56 kg/m  Body mass index is 21.56 kg/m.  Gen: Afebrile. No acute distress. Pleasant female.  HENT: AT. [place].  MMM.  Eyes:Pupils Equal Round Reactive to light, Extraocular movements intact,  Conjunctiva without redness, discharge or icterus. Abd: Soft. flat. NTND. BS present. no Masses palpated.  Skin: no rashes, purpura or petechiae.  Neuro:  Normal gait. PERLA. EOMi. Alert. Oriented.   Assessment/Plan: Cindy Thompson is a 31 y.o. female present for follow-up OV for  Chronic idiopathic constipation/Gastroesophageal reflux disease, esophagitis presence not specified - continue align daily.  - continue omeprazole at lower dose daily for another couple months, then can try to discontinue.  - continue GERD diet. MiIralax PRN.  - F/U PRN  Chronic seasonal allergic rhinitis due to pollen - allegra prescribed - may need flonase OTC as well if needed.  - F/U PRN   electronically signed by:  Felix Pacini, DO  [facility] [place]

## 2016-11-26 NOTE — Patient Instructions (Signed)
I have called in lower dose omeprazole (20 mg) day. Use for another 1-2 months. Then try to stop use, if symptoms return,  You can take daily if needed. I gave you enough refills.  Continue the align daily. Use miralax as needed.    Allergies: allegra allergy medicine. You may also need flonase nasal spray (it is over the counter).

## 2016-11-27 ENCOUNTER — Other Ambulatory Visit: Payer: BLUE CROSS/BLUE SHIELD | Admitting: Advanced Practice Midwife

## 2017-02-18 ENCOUNTER — Encounter: Payer: Self-pay | Admitting: Women's Health

## 2017-02-18 ENCOUNTER — Encounter (INDEPENDENT_AMBULATORY_CARE_PROVIDER_SITE_OTHER): Payer: Self-pay

## 2017-02-18 ENCOUNTER — Ambulatory Visit (INDEPENDENT_AMBULATORY_CARE_PROVIDER_SITE_OTHER): Payer: BLUE CROSS/BLUE SHIELD | Admitting: Women's Health

## 2017-02-18 VITALS — BP 102/64 | HR 64 | Ht 59.5 in | Wt 103.0 lb

## 2017-02-18 DIAGNOSIS — Z01419 Encounter for gynecological examination (general) (routine) without abnormal findings: Secondary | ICD-10-CM | POA: Diagnosis not present

## 2017-02-18 DIAGNOSIS — Z975 Presence of (intrauterine) contraceptive device: Secondary | ICD-10-CM | POA: Insufficient documentation

## 2017-02-18 NOTE — Progress Notes (Signed)
Subjective:   Cindy Thompson is a 31 y.o. [phone] Asian female here for a routine well-woman exam.  Patient's last menstrual period was 01/30/2017 (approximate).    Current complaints: none PCP: Dr. Claiborne Billings       Does not desire labs, done w/ PCP  Social History: Sexual: heterosexual Marital Status: married Living situation: with family Occupation: Patent attorney at [employer] Tobacco/alcohol: none Illicit drugs: no history of illicit drug use  The following portions of the patient's history were reviewed and updated as appropriate: allergies, current medications, past family history, past medical history, past social history, past surgical history and problem list.  Past Medical History Past Medical History:  Diagnosis Date  . Allergy   . GERD (gastroesophageal reflux disease)   . Thyroid disease    thyroid nodules    Past Surgical History History reviewed. No pertinent surgical history.  Gynecologic History [id number]  Patient's last menstrual period was 01/30/2017 (approximate). Contraception: IUD Last Pap: 02/13/16. Results were: normal Last mammogram: never. Results were: n/a Last TCS: never  Obstetric History OB History  Gravida Para Term Preterm AB Living  4 3 3   1 3   SAB TAB Ectopic Multiple Live Births  1       3    # Outcome Date GA Lbr Len/2nd Weight Sex Delivery Anes PTL Lv  4 Term 01/11/13 [redacted]w[redacted]d 01:23 / 00:03 8 lb 8 oz (3.856 kg) F Vag-Spont Local  LIV     Birth Comments: None  3 SAB 2012          2 Term 03/2010 [redacted]w[redacted]d  7 lb 11 oz (3.487 kg) M Vag-Spont  N LIV  1 Term 2007 [redacted]w[redacted]d  9 lb (4.082 kg) F Vag-Spont  N LIV      Current Medications Current Outpatient Prescriptions on File Prior to Visit  Medication Sig Dispense Refill  . levonorgestrel (MIRENA) 20 MCG/24HR IUD 1 each by Intrauterine route once.    Marland Kitchen omeprazole (PRILOSEC) 20 MG capsule Take 1 capsule (20 mg total) by mouth daily. 90 capsule 3  . fexofenadine (ALLEGRA ALLERGY) 180 MG tablet  Take 1 tablet (180 mg total) by mouth daily. (Patient not taking: Reported on 02/18/2017) 90 tablet 1   No current facility-administered medications on file prior to visit.     Review of Systems Patient denies any headaches, blurred vision, shortness of breath, chest pain, abdominal pain, problems with bowel movements, urination, or intercourse.  Objective:  BP 102/64 (BP Location: Right Arm, Patient Position: Sitting, Cuff Size: Normal)   Pulse 64   Ht 4' 11.5" (1.511 m)   Wt 103 lb (46.7 kg)   LMP 01/30/2017 (Approximate)   BMI 20.46 kg/m  Physical Exam  General:  Well developed, well nourished, no acute distress. She is alert and oriented x3. Skin:  Warm and dry Neck:  Midline trachea, known Rt thyroid nodule, no change Cardiovascular: Regular rate and rhythm, no murmur heard Lungs:  Effort normal, all lung fields clear to auscultation bilaterally Breasts:  No dominant palpable mass, retraction, or nipple discharge. Lt nipple inverted- has always been like this Abdomen:  Soft, non tender, no hepatosplenomegaly or masses Pelvic:  External genitalia is normal in appearance.  The vagina is normal in appearance. The cervix is bulbous, no CMT.  IUD strings not visible, same as last year. Thin prep pap is not done. Uterus is felt to be normal size, shape, and contour.  No adnexal masses or tenderness noted. Extremities:  No swelling or varicosities  noted Psych:  She has a normal mood and affect  Informal transabdominal u/s: IUD in correct placement at uterine fundus  Assessment:   Healthy well-woman exam IUD strings not visible, IUD in place Known Rt thyroid nodule  Plan:  F/U 543yr for physical, pap due 2020, or sooner if needed F/U w/ PCP for screening labs, thyroid f/u as needed Mammogram @31yo  or sooner if problems Colonoscopy @31yo  or sooner if problems  Marge DuncansBooker, Mikaili Flippin Randall CNM, [facility] CenterWHNP-BC 02/18/2017 11:25 AM

## 2017-11-18 ENCOUNTER — Encounter: Payer: Self-pay | Admitting: Family Medicine

## 2017-11-18 ENCOUNTER — Ambulatory Visit: Payer: BLUE CROSS/BLUE SHIELD | Admitting: Family Medicine

## 2017-11-18 VITALS — BP 97/65 | HR 72 | Temp 97.1°F | Resp 20 | Wt 106.2 lb

## 2017-11-18 DIAGNOSIS — Z789 Other specified health status: Secondary | ICD-10-CM | POA: Diagnosis not present

## 2017-11-18 DIAGNOSIS — M7711 Lateral epicondylitis, right elbow: Secondary | ICD-10-CM

## 2017-11-18 HISTORY — DX: Lateral epicondylitis, right elbow: M77.11

## 2017-11-18 MED ORDER — NAPROXEN 500 MG PO TABS: 500.0000 mg | ORAL_TABLET | Freq: Two times a day (BID) | ORAL | 0 refills | Status: DC

## 2017-11-18 MED ORDER — OMEPRAZOLE 20 MG PO CPDR: 20.0000 mg | DELAYED_RELEASE_CAPSULE | Freq: Every day | ORAL | 0 refills | Status: DC

## 2017-11-18 NOTE — Progress Notes (Signed)
Cindy Thompson , [birth year], 32 y.o., female MRN: [id number] Patient Care Team    Relationship Specialty Notifications Start End  Natalia Leatherwood, DO PCP - General Family Medicine  04/11/16     Chief Complaint  Patient presents with  . Elbow Pain    right no injury but lifts alot at her job has pain daily radiates and up and down arm   Interpreter Cindy Thompson present today.  Subjective: Pt presents for an OV with complaints of right elbow pain of 3-4 weeks duration.  Associated symptoms include pain in elbow and forearm daily. Occasional pain in occurs in her bicep. Describes the pain as "sore". She denies redness, swelling, numbness or tingling. 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 She has tried to used home to remove contents of basket instead of the movement, however even the grasping motion of the handle is painful. Pt has tried nothing to ease their symptoms.   Depression screen [facility] 2/9 02/18/2017 05/21/2016  Decreased Interest 0 0  Down, Depressed, Hopeless 0 0  PHQ - 2 Score 0 0    No Known Allergies Social History   Tobacco Use  . Smoking status: Never Smoker  . Smokeless tobacco: Never Used  Substance Use Topics  . Alcohol use: No   Past Medical History:  Diagnosis Date  . Allergy   . GERD (gastroesophageal reflux disease)   . Thyroid disease    thyroid nodules   History reviewed. No pertinent surgical history. Family History  Problem Relation Age of Onset  . Suicidality Father   . Ovarian cancer Paternal Grandmother    Allergies as of 11/18/2017   No Known Allergies     Medication List        Accurate as of 11/18/17 11:17 AM. Always use your most recent med list.          fexofenadine 180 MG tablet Commonly known as:  ALLEGRA ALLERGY Take 1 tablet (180 mg total) by mouth daily.   levonorgestrel 20 MCG/24HR IUD Commonly  known as:  MIRENA 1 each by Intrauterine route once.   omeprazole 20 MG capsule Commonly known as:  PRILOSEC Take 1 capsule (20 mg total) by mouth daily.       All past medical history, surgical history, allergies, family history, immunizations andmedications were updated in the EMR today and reviewed under the history and medication portions of their EMR.     ROS: Negative, with the exception of above mentioned in HPI   Objective:  BP 97/65 (BP Location: Left Arm, Patient Position: Sitting, Cuff Size: Normal)   Pulse 72   Temp (!) 97.1 F (36.2 C)   Resp 20   Wt 106 lb 4 oz (48.2 kg)   SpO2 98%   BMI 21.10 kg/m  Body mass index is 21.1 kg/m. Gen: Afebrile. No acute distress. Nontoxic in appearance, well developed, well nourished.  MSK: no erythema, no soft tissue swelling. Mild TTP left lateral elbow. Neg tinel elbow or wrist. Pain with resisted supination/pronation. 5/5 MS PRESENT. NV intact distally.  Skin: no rashes, purpura or petechiae.    No exam data present No results found. No results found for this or any previous visit (from the past 24 hour(s)).  Assessment/Plan: Talaysha Freeberg is a 32 y.o. female present for OV for  Lateral epicondylitis of right elbow - Interpreter  used for visit.  - Overuse injury from work. Discussed dx w/ pt today. Advised her to wear the band even after improvement to hep prevent recurrence which is likely high for her given her job. Avoid offending movement if able, although this is her job, so likely will not be able to stop all together.  - Ice/heat therapy discussed. Nsaids. Rest. Education on tennis elbow provided. - naproxen BID for 7 days with food. Take Prilosec if using naproxen - tennis elbow band use daily. - F/U 4 weeks PRN, if not improving refer to SM and consider injections.     Reviewed expectations re: course of current medical issues.  Discussed self-management of symptoms.  Outlined signs and symptoms indicating need  for more acute intervention.  Patient verbalized understanding and all questions were answered.  Patient received an After-Visit Summary.    No orders of the defined types were placed in this encounter.    Note is dictated utilizing voice recognition software. Although note has been proof read prior to signing, occasional typographical errors still can be missed. If any questions arise, please do not hesitate to call for verification.   electronically signed by:  Felix Pacinienee Richards Pherigo, DO  [facility] [place]

## 2017-11-18 NOTE — Patient Instructions (Signed)
Use arm band everyday while active/working.  Start naproxen every 12 hours for 7 days with food.     Tennis Elbow Tennis elbow is puffiness (inflammation) of the outer tendons of your forearm close to your elbow. Your tendons attach your muscles to your bones. Tennis elbow can happen in any sport or job in which you use your elbow too much. It is caused by doing the same motion over and over. Tennis elbow can cause:  Pain and tenderness in your forearm and the outer part of your elbow.  A burning feeling. This runs from your elbow through your arm.  Weak grip in your hands.  Follow these instructions at home: Activity  Rest your elbow and wrist as told by your doctor. Try to avoid any activities that caused the problem until your doctor says that you can do them again.  If a physical therapist teaches you exercises, do all of them as told.  If you lift an object, lift it with your palm facing up. This is easier on your elbow. Lifestyle  If your tennis elbow is caused by sports, check your equipment and make sure that: ? You are using it correctly. ? It fits you well.  If your tennis elbow is caused by work, take breaks often, if you are able. Talk with your manager about doing your work in a way that is safe for you. ? If your tennis elbow is caused by computer use, talk with your manager about any changes that can be made to your work setup. General instructions  If told, apply ice to the painful area: ? Put ice in a plastic bag. ? Place a towel between your skin and the bag. ? Leave the ice on for 20 minutes, 2-3 times per day.  Take medicines only as told by your doctor.  If you were given a brace, wear it as told by your doctor.  Keep all follow-up visits as told by your doctor. This is important. Contact a doctor if:  Your pain does not get better with treatment.  Your pain gets worse.  You have weakness in your forearm, hand, or fingers.  You cannot feel your  forearm, hand, or fingers. This information is not intended to replace advice given to you by your health care provider. Make sure you discuss any questions you have with your health care provider. Document Released: 03/07/2010 Document Revised: 05/17/2016 Document Reviewed: 09/13/2014 Elsevier Interactive Patient Education  [employer]2018 Elsevier Inc.

## 2017-11-19 DIAGNOSIS — J329 Chronic sinusitis, unspecified: Secondary | ICD-10-CM | POA: Diagnosis not present

## 2017-11-19 DIAGNOSIS — J209 Acute bronchitis, unspecified: Secondary | ICD-10-CM | POA: Diagnosis not present

## 2017-11-19 DIAGNOSIS — J111 Influenza due to unidentified influenza virus with other respiratory manifestations: Secondary | ICD-10-CM | POA: Diagnosis not present

## 2018-01-27 DIAGNOSIS — J209 Acute bronchitis, unspecified: Secondary | ICD-10-CM | POA: Diagnosis not present

## 2018-02-02 DIAGNOSIS — Z79899 Other long term (current) drug therapy: Secondary | ICD-10-CM | POA: Insufficient documentation

## 2018-02-02 DIAGNOSIS — T7840XA Allergy, unspecified, initial encounter: Secondary | ICD-10-CM | POA: Diagnosis not present

## 2018-02-02 DIAGNOSIS — L509 Urticaria, unspecified: Secondary | ICD-10-CM | POA: Diagnosis not present

## 2018-02-03 ENCOUNTER — Emergency Department (HOSPITAL_COMMUNITY)
Admission: EM | Admit: 2018-02-03 | Discharge: 2018-02-03 | Disposition: A | Discharge status: Home / Self Care | Payer: BLUE CROSS/BLUE SHIELD | Attending: Emergency Medicine | Admitting: Emergency Medicine

## 2018-02-03 ENCOUNTER — Other Ambulatory Visit: Payer: Self-pay

## 2018-02-03 ENCOUNTER — Encounter (HOSPITAL_COMMUNITY): Payer: Self-pay | Admitting: *Deleted

## 2018-02-03 DIAGNOSIS — T7840XA Allergy, unspecified, initial encounter: Secondary | ICD-10-CM

## 2018-02-03 MED ORDER — HYDROXYZINE HCL 25 MG PO TABS: 25.0000 mg | ORAL_TABLET | Freq: Four times a day (QID) | ORAL | 0 refills | Status: DC | PRN

## 2018-02-03 MED ORDER — PREDNISONE 20 MG PO TABS: ORAL_TABLET | ORAL | 0 refills | Status: DC

## 2018-02-03 MED ORDER — DEXAMETHASONE SODIUM PHOSPHATE 10 MG/ML IJ SOLN
10.0000 mg | Freq: Once | INTRAMUSCULAR | Status: AC
  Administered 2018-02-03: 10 mg via INTRAMUSCULAR
  Filled 2018-02-03: qty 1

## 2018-02-03 MED ORDER — HYDROXYZINE HCL 50 MG/ML IM SOLN
25.0000 mg | Freq: Once | INTRAMUSCULAR | Status: AC
  Administered 2018-02-03: 25 mg via INTRAMUSCULAR
  Filled 2018-02-03: qty 1

## 2018-02-03 NOTE — ED Triage Notes (Signed)
Pt c/o redness and hives all over body x one day after eating crab

## 2018-02-03 NOTE — ED Provider Notes (Signed)
[facility] EMERGENCY DEPARTMENT Provider Note   CSN: [id number] Arrival date & time: 02/02/18  2350     History   Chief Complaint Chief Complaint  Patient presents with  . Allergic Reaction    HPI Cindy Thompson is a 32 y.o. female.  Patient presents with complaints of itchy rash that started 1 or 2 hours after eating crab last night.  She reports that she has been experiencing itchy areas all over her skin since that time, had some slight improvement with Benadryl earlier but symptoms reoccurred.  No tongue swelling, throat swelling, difficulty breathing.  No previous history of food allergy.     Past Medical History:  Diagnosis Date  . Allergy   . GERD (gastroesophageal reflux disease)   . Thyroid disease    thyroid nodules    Patient Active Problem List   Diagnosis Date Noted  . Lateral epicondylitis of right elbow 11/18/2017  . IUD (intrauterine device) in place 02/18/2017  . Chronic seasonal allergic rhinitis due to pollen 11/26/2016  . Acne vulgaris 03/04/2015  . Gastroesophageal reflux disease 07/12/2014  . Constipation 12/21/2013    History reviewed. No pertinent surgical history.   OB History    Gravida  4   Para  3   Term  3   Preterm      AB  1   Living  3     SAB  1   TAB      Ectopic      Multiple      Live Births  3            Home Medications    Prior to Admission medications   Medication Sig Start Date End Date Taking? Authorizing Provider  fexofenadine (ALLEGRA ALLERGY) 180 MG tablet Take 1 tablet (180 mg total) by mouth daily. 11/26/16   Kuneff, Renee A, DO  hydrOXYzine (ATARAX/VISTARIL) 25 MG tablet Take 1-2 tablets (25-50 mg total) by mouth every 6 (six) hours as needed for itching. 02/03/18   Gilda Crease, MD  levonorgestrel (MIRENA) 20 MCG/24HR IUD 1 each by Intrauterine route once.    [provider]  naproxen (NAPROSYN) 500 MG tablet Take 1 tablet (500 mg total) by mouth 2 (two) times daily with a meal.  11/18/17   Kuneff, Renee A, DO  omeprazole (PRILOSEC) 20 MG capsule Take 1 capsule (20 mg total) by mouth daily. 11/18/17   Kuneff, Renee A, DO  predniSONE (DELTASONE) 20 MG tablet 3 tabs po daily x 3 days, then 2 tabs x 3 days, then 1.5 tabs x 3 days, then 1 tab x 3 days, then 0.5 tabs x 3 days 02/03/18   Gilda Crease, MD    Family History Family History  Problem Relation Age of Onset  . Suicidality Father   . Ovarian cancer Paternal Grandmother     Social History Social History   Tobacco Use  . Smoking status: Never Smoker  . Smokeless tobacco: Never Used  Substance Use Topics  . Alcohol use: No  . Drug use: No     Allergies   Patient has no known allergies.   Review of Systems Review of Systems  Skin: Positive for rash.  All other systems reviewed and are negative.    Physical Exam Updated Vital Signs BP (!) 112/55 (BP Location: Right Arm)   Pulse 80   Temp 98.2 F (36.8 C) (Oral)   Resp 18   Ht 5' (1.524 m)   Wt 49 kg (  108 lb)   LMP 01/21/2018   SpO2 100%   BMI 21.09 kg/m   Physical Exam  Constitutional: She is oriented to person, place, and time. She appears well-developed and well-nourished. No distress.  HENT:  Head: Normocephalic and atraumatic.  Right Ear: Hearing normal.  Left Ear: Hearing normal.  Nose: Nose normal.  Mouth/Throat: Oropharynx is clear and moist and mucous membranes are normal.  Eyes: Pupils are equal, round, and reactive to light. Conjunctivae and EOM are normal.  Neck: Normal range of motion. Neck supple.  Cardiovascular: Regular rhythm, S1 normal and S2 normal. Exam reveals no gallop and no friction rub.  No murmur heard. Pulmonary/Chest: Effort normal and breath sounds normal. No respiratory distress. She exhibits no tenderness.  Abdominal: Soft. Normal appearance and bowel sounds are normal. There is no hepatosplenomegaly. There is no tenderness. There is no rebound, no guarding, no tenderness at McBurney's point and  negative Murphy's sign. No hernia.  Musculoskeletal: Normal range of motion.  Neurological: She is alert and oriented to person, place, and time. She has normal strength. No cranial nerve deficit or sensory deficit. Coordination normal. GCS eye subscore is 4. GCS verbal subscore is 5. GCS motor subscore is 6.  Skin: Skin is warm, dry and intact. No rash (Scattered urticaria) noted. No cyanosis.  Psychiatric: She has a normal mood and affect. Her speech is normal and behavior is normal. Thought content normal.  Nursing note and vitals reviewed.    ED Treatments / Results  Labs (all labs ordered are listed, but only abnormal results are displayed) Labs Reviewed - No data to display  EKG None  Radiology No results found.  Procedures Procedures (including critical care time)  Medications Ordered in ED Medications  hydrOXYzine (VISTARIL) injection 25 mg (has no administration in time range)  dexamethasone (DECADRON) injection 10 mg (has no administration in time range)     Initial Impression / Assessment and Plan / ED Course  I have reviewed the triage vital signs and the nursing notes.  Pertinent labs & imaging results that were available during my care of the patient were reviewed by me and considered in my medical decision making (see chart for details).     Patient presents with scattered urticaria that have been present for 24 hours after eating crab.  No previous history of food allergy.  No airway involvement.  No GI complaints.  Appears to be isolated to the skin.  She has had some partial relief with Benadryl.  Will try Atarax because she is having significant itching.  Administered IM Decadron and will continue prednisone taper.  Patient counseled to avoid shellfish until she can follow-up for possible skin testing.  Final Clinical Impressions(s) / ED Diagnoses   Final diagnoses:  Allergic reaction, initial encounter    ED Discharge Orders        Ordered     hydrOXYzine (ATARAX/VISTARIL) 25 MG tablet  Every 6 hours PRN     02/03/18 0052    predniSONE (DELTASONE) 20 MG tablet     02/03/18 0052       Gilda Crease, MD 02/03/18 [id number]

## 2018-02-03 NOTE — Discharge Instructions (Addendum)
Follow-up with your doctor to be referred to an allergy specialist.  Do not eat any crab or other shellfish until you see the specialist.

## 2018-02-04 ENCOUNTER — Ambulatory Visit: Payer: BLUE CROSS/BLUE SHIELD | Admitting: Family Medicine

## 2018-02-04 ENCOUNTER — Encounter: Payer: Self-pay | Admitting: Family Medicine

## 2018-02-04 VITALS — BP 91/60 | HR 71 | Temp 97.9°F | Resp 16 | Wt 108.0 lb

## 2018-02-04 DIAGNOSIS — T7840XA Allergy, unspecified, initial encounter: Secondary | ICD-10-CM

## 2018-02-04 DIAGNOSIS — L509 Urticaria, unspecified: Secondary | ICD-10-CM | POA: Diagnosis not present

## 2018-02-04 DIAGNOSIS — L500 Allergic urticaria: Secondary | ICD-10-CM | POA: Insufficient documentation

## 2018-02-04 HISTORY — DX: Allergic urticaria: L50.0

## 2018-02-04 MED ORDER — LEVOCETIRIZINE DIHYDROCHLORIDE 5 MG PO TABS: 5.0000 mg | ORAL_TABLET | Freq: Every evening | ORAL | 11 refills | Status: DC

## 2018-02-04 MED ORDER — RANITIDINE HCL 150 MG PO TABS: 150.0000 mg | ORAL_TABLET | Freq: Two times a day (BID) | ORAL | 1 refills | Status: DC

## 2018-02-04 NOTE — Patient Instructions (Addendum)
I have referred you to an allergist so you may have allergy testing.  Avoid all shellfish until tested.  Finish prednisone taper.  You can use vistaril as needed (but you can stop it). Start xyzal at night.  Start zantac every 12 hours.    Anaphylactic Reaction An anaphylactic reaction (anaphylaxis) is a sudden allergic reaction that is very bad (severe). It also affects more than one part of the body. This condition can be life-threatening. If you have an anaphylactic reaction, you need to get medical help right away. You may need to stay in the hospital. Your doctor may teach you how to use an allergy kit (anaphylaxis kit) and how to give yourself an allergy shot (epinephrine injection). You can give yourself an allergy shot with what is commonly called an auto-injector "pen." Symptoms of an anaphylactic reaction may include:  A stuffy nose (nasal congestion).  Headache.  Tingling in your mouth.  A flushed face.  An itchy, red rash.  Swelling of your eyes, lips, face, or tongue.  Swelling of the back of your mouth and your throat.  Breathing loudly (wheezing).  A hoarse voice.  Itchy, red, swollen areas of skin (hives).  Dizziness or light-headedness.  Passing out (fainting).  Feeling worried or nervous (anxiety).  Feeling confused.  Pain in your belly (abdomen) or chest.  Trouble with breathing, talking, or swallowing.  A tight feeling in your chest or throat.  Fast or uneven heartbeats (palpitations).  Throwing up (vomiting).  Watery poop (diarrhea).  Follow these instructions at home: Safety  Always keep an auto-injector pen or your allergy kit with you. These could save your life. Use them as told by your doctor.  Do not drive until your doctor says that it is safe.  Make sure that you, the people who live with you, and your employer know: ? How to use your allergy kit. ? How to use an auto-injector pen to give you an allergy shot.  If you used  your auto-injector pen: ? Get more medicine for it right away. This is important in case you have another reaction. ? Get help right away.  Wear a bracelet or necklace that says you have an allergy, if your doctor tells you to do this.  Learn the signs of a very bad allergic reaction.  Work with your doctors to make a plan for what to do if you have a very bad allergic reaction. Being prepared is important. General instructions  Take over-the-counter and prescription medicines only as told by your doctor.  If you have itchy, red, swollen areas of skin or a rash: ? Use over-the-counter medicine (antihistamine) as told by your doctor. ? Put cold, wet cloths (cold compresses) on your skin. ? Take baths or showers in cool water. Avoid hot water.  If you had tests done, it is up to you to get your test results. Ask your doctor when your results will be ready.  Tell any doctors who care for you that you have an allergy.  Keep all follow-up visits as told by your doctor. This is important. How is this prevented?  Avoid things (allergens) that gave you a very bad allergic reaction before.  If you have a food allergy and you go to a restaurant, tell your server about your allergy. If you are not sure if your meal was made with food that you are allergic to, ask your server before you eat it. Contact a doctor if:  You have symptoms of  an allergic reaction. You may notice them soon after being around whatever it is that you are allergic to. Symptoms may include: ? A rash. ? A headache. ? Sneezing or a runny nose. ? Swelling. ? Feeling sick to your stomach. ? Watery poop. Get help right away if:  You had to use your auto-injector pen. You must go to the emergency room even if the medicine seems to be working.  You have any of these: ? A tight feeling in your chest or your throat. ? Loud breathing. ? Trouble with breathing. ? Itchy, red, swollen areas of skin. ? Red skin or itching  all over your body. ? Swelling in your lips, tongue, or the back of your throat.  You have throwing up that gets very bad.  You have watery poop that gets very bad.  You pass out or feel like you might pass out. These symptoms may be an emergency. Do not wait to see if the symptoms will go away. Use your auto-injector pen or allergy kit as you have been told. Get medical help right away. Call your local emergency services (911 in the [place] Do not drive yourself to the hospital. Summary  An anaphylactic reaction (anaphylaxis) is a sudden allergic reaction that is very bad (severe).  This condition can be life-threatening. If you have an anaphylactic reaction, you need to get medical help right away.  Your doctor may teach you how to use an allergy kit (anaphylaxis kit) and how to give yourself an allergy shot (epinephrine injection) with an auto-injector "pen."  Always keep an auto-injector pen or your allergy kit with you. These could save your life. Use them as told by your doctor.  If you had to use your auto-injector pen, you must go to the emergency room even if the medicine seems to be working. This information is not intended to replace advice given to you by your health care provider. Make sure you discuss any questions you have with your health care provider. Document Released: 03/05/2008 Document Revised: 05/11/2016 Document Reviewed: 05/11/2016 Elsevier Interactive Patient Education  2017 [employer].

## 2018-02-04 NOTE — Progress Notes (Signed)
Cindy Thompson , 06-Oct-1985, 32 y.o., female MRN: [id number] Patient Care Team    Relationship Specialty Notifications Start End  Natalia Leatherwood, DO PCP - General Family Medicine  04/11/16     Chief Complaint  Patient presents with  . Hospitalization Follow-up    ED follow up allergies    Pt presented with interpreter today.   Subjective: Pt presents for an OV after ED visit for allergic reaction presumed to be secondary to crab. She has never had a reaction to crab prior. She developed hives shortly after consuming crab yesterday. She was provided with vistaril, IM depo medrol and prednisone taper.  She did not experience any associated symptoms outside of skin reaction.  She had a rash/skin reaction to a cat last year.    Depression screen [facility] 2/9 02/18/2017 05/21/2016  Decreased Interest 0 0  Down, Depressed, Hopeless 0 0  PHQ - 2 Score 0 0    Allergies  Allergen Reactions  . Crab [Shellfish Allergy] Rash   Social History   Tobacco Use  . Smoking status: Never Smoker  . Smokeless tobacco: Never Used  Substance Use Topics  . Alcohol use: No   Past Medical History:  Diagnosis Date  . Allergy   . GERD (gastroesophageal reflux disease)   . Thyroid disease    thyroid nodules   History reviewed. No pertinent surgical history. Family History  Problem Relation Age of Onset  . Suicidality Father   . Ovarian cancer Paternal Grandmother    Allergies as of 02/04/2018      Reactions   Crab [shellfish Allergy] Rash      Medication List        Accurate as of 02/04/18 11:05 AM. Always use your most recent med list.          fexofenadine 180 MG tablet Commonly known as:  ALLEGRA ALLERGY Take 1 tablet (180 mg total) by mouth daily.   hydrOXYzine 25 MG tablet Commonly known as:  ATARAX/VISTARIL Take 1-2 tablets (25-50 mg total) by mouth every 6 (six) hours as needed for itching.   levonorgestrel 20 MCG/24HR IUD Commonly known as:  MIRENA 1 each by Intrauterine route  once.   naproxen 500 MG tablet Commonly known as:  NAPROSYN Take 1 tablet (500 mg total) by mouth 2 (two) times daily with a meal.   omeprazole 20 MG capsule Commonly known as:  PRILOSEC Take 1 capsule (20 mg total) by mouth daily.   predniSONE 20 MG tablet Commonly known as:  DELTASONE 3 tabs po daily x 3 days, then 2 tabs x 3 days, then 1.5 tabs x 3 days, then 1 tab x 3 days, then 0.5 tabs x 3 days       All past medical history, surgical history, allergies, family history, immunizations andmedications were updated in the EMR today and reviewed under the history and medication portions of their EMR.     ROS: Negative, with the exception of above mentioned in HPI   Objective:  BP 91/60 (BP Location: Left Arm, Patient Position: Sitting, Cuff Size: Normal)   Pulse 71   Temp 97.9 F (36.6 C) (Oral)   Resp 16   Wt 108 lb (49 kg)   LMP 01/21/2018   SpO2 98%   BMI 21.09 kg/m  Body mass index is 21.09 kg/m. Gen: Afebrile. No acute distress. Nontoxic in appearance, well developed, well nourished.  HENT: AT. [place].  MMM Eyes:Pupils Equal Round Reactive to light, Extraocular movements intact,  Conjunctiva without redness, discharge or icterus. CV: RRR  Chest: CTAB, no wheeze or crackles. Good air movement, normal resp effort.  Skin: macular  Rash over trunk, arms, legs and neck, purpura or petechiae.  Neuro: Normal gait. PERLA. EOMi. Alert. Oriented x3   No exam data present No results found. No results found for this or any previous visit (from the past 24 hour(s)).  Assessment/Plan: [name] is a 32 y.o. female present for OV for  Allergic reaction, initial encounter/Urticaria - possible allergy to crab. Pt advised to avoid all shell fish until able to be tested. Referral to allergist.  - start xyzal and zantac, prescribed today. Stop vistaril unless needed. - continue prednisone.   - Ambulatory referral to Allergy - levocetirizine (XYZAL) 5 MG tablet; Take 1 tablet (5 mg  total) by mouth every evening.  Dispense: 30 tablet; Refill: 11 - ranitidine (ZANTAC) 150 MG tablet; Take 1 tablet (150 mg total) by mouth 2 (two) times daily.  Dispense: 60 tablet; Refill: 1 - F/U PRN  Reviewed expectations re: course of current medical issues.  Discussed self-management of symptoms.  Outlined signs and symptoms indicating need for more acute intervention.  Patient verbalized understanding and all questions were answered.  Patient received an After-Visit Summary.    No orders of the defined types were placed in this encounter.    Note is dictated utilizing voice recognition software. Although note has been proof read prior to signing, occasional typographical errors still can be missed. If any questions arise, please do not hesitate to call for verification.   electronically signed by:  Felix Pacini, DO  [facility] [place]

## 2018-02-14 DIAGNOSIS — J069 Acute upper respiratory infection, unspecified: Secondary | ICD-10-CM | POA: Diagnosis not present

## 2018-02-14 DIAGNOSIS — J209 Acute bronchitis, unspecified: Secondary | ICD-10-CM | POA: Diagnosis not present

## 2018-03-20 ENCOUNTER — Telehealth: Payer: Self-pay

## 2018-03-20 NOTE — Telephone Encounter (Signed)
Copied from CRM [phone]. Topic: General - Other >> Mar 20, 2018  1:11 PM Gerrianne ScalePayne, Angela L wrote: Reason for CRM: pt states that she had RX polyethylene glycol and it helps with her constipation she would like to get another refilled [facility] [place] [facility] wouldn't give her a refill on this medicine if she get a prescription please send it to the [facility] [place], [place]

## 2018-03-21 NOTE — Telephone Encounter (Signed)
Contacted patient let her know this medication is Miralax and is available over there counter. She states it is not working for her advised her to schedule an appt with Dr Claiborne BillingsKuneff. She stated she will call back to schedule.

## 2018-03-24 ENCOUNTER — Encounter: Payer: Self-pay | Admitting: Allergy and Immunology

## 2018-03-24 ENCOUNTER — Ambulatory Visit: Payer: BLUE CROSS/BLUE SHIELD | Admitting: Allergy and Immunology

## 2018-03-24 VITALS — BP 120/78 | HR 68 | Temp 97.8°F | Resp 16 | Ht <= 58 in | Wt 111.4 lb

## 2018-03-24 DIAGNOSIS — T781XXA Other adverse food reactions, not elsewhere classified, initial encounter: Secondary | ICD-10-CM | POA: Diagnosis not present

## 2018-03-24 DIAGNOSIS — H1010 Acute atopic conjunctivitis, unspecified eye: Secondary | ICD-10-CM | POA: Insufficient documentation

## 2018-03-24 DIAGNOSIS — J3089 Other allergic rhinitis: Secondary | ICD-10-CM | POA: Diagnosis not present

## 2018-03-24 DIAGNOSIS — L500 Allergic urticaria: Secondary | ICD-10-CM

## 2018-03-24 DIAGNOSIS — H1013 Acute atopic conjunctivitis, bilateral: Secondary | ICD-10-CM

## 2018-03-24 DIAGNOSIS — T7840XD Allergy, unspecified, subsequent encounter: Secondary | ICD-10-CM | POA: Diagnosis not present

## 2018-03-24 MED ORDER — FLUTICASONE PROPIONATE 50 MCG/ACT NA SUSP: 1.0000 | Freq: Two times a day (BID) | NASAL | 5 refills | Status: DC

## 2018-03-24 MED ORDER — OLOPATADINE HCL 0.7 % OP SOLN: 1.0000 [drp] | Freq: Every day | OPHTHALMIC | 5 refills | Status: DC | PRN

## 2018-03-24 NOTE — Assessment & Plan Note (Signed)
   Treatment plan as outlined above for allergic rhinitis.  A prescription has been provided for Pazeo, one drop per eye daily as needed.  I have also recommended eye lubricant drops (i.e., Natural Tears) as needed. 

## 2018-03-24 NOTE — Assessment & Plan Note (Addendum)
The patient's history and skin test results suggest allergic urticaria secondary to pollen exposure vs urticaria associated with viral infection. Skin tests to select food allergens were negative today despite a positive histamine control.  The negative predictive value for food allergen skin testing is excellent, however, as she consumed crab legs within a few hours of symptom onset and has not eaten crab since that time, we will confirm skin test results with labs to be thorough. NSAIDs and emotional stress commonly exacerbate urticaria but are not the underlying etiology in this case. Physical urticarias are negative by history (i.e. pressure-induced, temperature, vibration, solar, etc.). History and lesions are not consistent with urticaria pigmentosa so I am not suspicious for mastocytosis. There are no concomitant symptoms concerning for anaphylaxis or constitutional symptoms worrisome for an underlying malignancy.   The following labs have been ordered: FCeRI antibody, anti-thyroglobulin antibody, thyroid peroxidase antibody, tryptase, serum specific IgE against shellfish panel and alpha gal panel.   The patient will be called with further recommendations after lab results have returned.  Should symptoms recur, restart H1/H2 receptor blockade with levocetirizine and ranitidine.  Should symptoms recur in the absence of pollen exposure, a journal is to be kept recording any foods eaten, beverages consumed, medications taken within a 6 hour period prior to the onset of symptoms, as well as record activities being performed, and environmental conditions. For any symptoms concerning for anaphylaxis, epinephrine is to be administered and 911 is to be called immediately.  A prescription has been provided for epinephrine autoinjector (Auvi-Q) 0.3 mg 2 pack.

## 2018-03-24 NOTE — Assessment & Plan Note (Signed)
Aeroallergen skin test revealed robust reactivity to grass pollen and tree pollen.  She is also sensitized to ragweed pollen, weed pollen, and molds.  Aeroallergen avoidance measures have been discussed and provided in written form.  Continue levocetirizine 5 mg daily if needed.  A prescription has been provided for fluticasone nasal spray, one spray per nostril 1-2 times daily as needed. Proper nasal spray technique has been discussed and demonstrated.  Nasal saline spray (i.e., Simply Saline) or nasal saline lavage (i.e., NeilMed) is recommended as needed and prior to medicated nasal sprays.  If allergen avoidance measures and medications fail to adequately relieve symptoms, aeroallergen immunotherapy will be considered.

## 2018-03-24 NOTE — Progress Notes (Signed)
New Patient Note  RE: Cindy Thompson MRN: [id number] DOB: 08-04-1986 Date of Office Visit: 03/24/2018  Referring provider: Natalia Leatherwood, DO Primary care provider: Natalia Leatherwood, DO  Chief Complaint: Urticaria; Allergic Reaction; and Allergic Rhinitis    History of present illness: Cindy Thompson is a 32 y.o. female seen today in consultation requested by Felix Pacini, DO.  She is accompanied today by an interpreter who assists with the history.  On the evening of Feb 02, 2018 she began to experience generalized pruritus.  On May 6 she noticed hives developing on her arms, legs, and abdomen.  She went to the emergency department and was treated with Decadron, prednisone taper, and hydroxyzine.  The next day she saw her primary care physician and was started on levo cetirizine 5 mg daily and ranitidine 150 mg twice daily.  She had episodes of urticaria and pruritus which lasted 4 or 5 days prior to resolving without recurrence.  Individual hives were red, raised, and pruritic and resolved completely within 24 hours without residual pigmentation or bruising.  She did not experience concomitant angioedema, cardiopulmonary symptoms, or GI symptoms.  No specific medication, food, skin care product, detergent, soap, or other environmental triggers have been identified.  She does note that she had consumed crab legs approximately 3 hours before the onset of pruritus on May 5, however she typically consumes shellfish on a regular basis without symptoms.  She has not consumed crab legs since that time due to fear of another reaction.  She also notes that she had recently returned from a trip to [place] and had symptoms consistent with a viral upper respiratory tract infection at the time of onset of pruritus and urticaria.  Finally, she notes that she experiences pruritus when she is in contact with cats or dogs.  She does have a cat in her apartment, however reports that she has not been in direct contact with the cat  for a long time and does not seem to believe that this is what caused the urticaria. The patient experiences nasal congestion, rhinorrhea, sneezing, postnasal drainage, nasal pruritus, ocular pruritus, and occasional sinus pressure.  These symptoms are most frequent and severe during the springtime.   Assessment and plan: Allergic urticaria The patient's history and skin test results suggest allergic urticaria secondary to pollen exposure vs urticaria associated with viral infection. Skin tests to select food allergens were negative today despite a positive histamine control.  The negative predictive value for food allergen skin testing is excellent, however, as she consumed crab legs within a few hours of symptom onset and has not eaten crab since that time, we will confirm skin test results with labs to be thorough. NSAIDs and emotional stress commonly exacerbate urticaria but are not the underlying etiology in this case. Physical urticarias are negative by history (i.e. pressure-induced, temperature, vibration, solar, etc.). History and lesions are not consistent with urticaria pigmentosa so I am not suspicious for mastocytosis. There are no concomitant symptoms concerning for anaphylaxis or constitutional symptoms worrisome for an underlying malignancy.   The following labs have been ordered: FCeRI antibody, anti-thyroglobulin antibody, thyroid peroxidase antibody, tryptase, serum specific IgE against shellfish panel and alpha gal panel.   The patient will be called with further recommendations after lab results have returned.  Should symptoms recur, restart H1/H2 receptor blockade with levocetirizine and ranitidine.  Should symptoms recur in the absence of pollen exposure, a journal is to be kept recording any foods eaten, beverages consumed,  medications taken within a 6 hour period prior to the onset of symptoms, as well as record activities being performed, and environmental conditions. For any  symptoms concerning for anaphylaxis, epinephrine is to be administered and 911 is to be called immediately.  A prescription has been provided for epinephrine autoinjector (Auvi-Q) 0.3 mg 2 pack.  Seasonal and perennial allergic rhinitis Aeroallergen skin test revealed robust reactivity to grass pollen and tree pollen.  She is also sensitized to ragweed pollen, weed pollen, and molds.  Aeroallergen avoidance measures have been discussed and provided in written form.  Continue levocetirizine 5 mg daily if needed.  A prescription has been provided for fluticasone nasal spray, one spray per nostril 1-2 times daily as needed. Proper nasal spray technique has been discussed and demonstrated.  Nasal saline spray (i.e., Simply Saline) or nasal saline lavage (i.e., NeilMed) is recommended as needed and prior to medicated nasal sprays.  If allergen avoidance measures and medications fail to adequately relieve symptoms, aeroallergen immunotherapy will be considered.  Allergic conjunctivitis  Treatment plan as outlined above for allergic rhinitis.  A prescription has been provided for Pazeo, one drop per eye daily as needed.  I have also recommended eye lubricant drops (i.e., Natural Tears) as needed.   Meds ordered this encounter  Medications  . fluticasone (FLONASE) 50 MCG/ACT nasal spray    Sig: Place 1 spray into both nostrils 2 (two) times daily.    Dispense:  16 g    Refill:  5  . Olopatadine HCl (PAZEO) 0.7 % SOLN    Sig: Place 1 drop into both eyes daily as needed.    Dispense:  1 Bottle    Refill:  5    Diagnostics: Epicutaneous testing: Positive to grass pollen, ragweed pollen, weed pollen, and tree pollen. Intradermal testing: Positive to perennial mold mix 4. Food allergen skin testing: Negative despite a positive histamine control.    Physical examination: Blood pressure 120/78, pulse 68, temperature 97.8 F (36.6 C), temperature source Oral, resp. rate 16, height 4'  9.87" (1.47 m), weight 111 lb 6.4 oz (50.5 kg).  General: Alert, interactive, in no acute distress. HEENT: TMs pearly gray, turbinates moderately edematous with thick discharge, post-pharynx erythematous. Neck: Supple without lymphadenopathy. Lungs: Clear to auscultation without wheezing, rhonchi or rales. CV: Normal S1, S2 without murmurs. Abdomen: Nondistended, nontender. Skin: Warm and dry, without lesions or rashes. Extremities:  No clubbing, cyanosis or edema. Neuro:   Grossly intact.  Review of systems:  Review of systems negative except as noted in HPI / PMHx or noted below: Review of Systems  Constitutional: Negative.   HENT: Negative.   Eyes: Negative.   Respiratory: Negative.   Cardiovascular: Negative.   Gastrointestinal: Negative.   Genitourinary: Negative.   Musculoskeletal: Negative.   Skin: Negative.   Neurological: Negative.   Endo/Heme/Allergies: Negative.   Psychiatric/Behavioral: Negative.     Past medical history:  Past Medical History:  Diagnosis Date  . Allergy   . Angio-edema   . GERD (gastroesophageal reflux disease)   . Thyroid disease    thyroid nodules  . Urticaria     Past surgical history:  Reviewed.  No pertinent surgical history reported.  Family history: Family History  Problem Relation Age of Onset  . Suicidality Father   . Ovarian cancer Paternal Grandmother     Social history: Social History   Socioeconomic History  . Marital status: Married    Spouse name: Not on file  . Number of children: 3  .  Years of education: Not on file  . Highest education level: Not on file  Occupational History  . Occupation: Sports administrator    Employer: [employer] [place]  Social Needs  . Financial resource strain: Not on file  . Food insecurity:    Worry: Not on file    Inability: Not on file  . Transportation needs:    Medical: Not on file    Non-medical: Not on file  Tobacco Use  . Smoking status: Never Smoker  . Smokeless tobacco:  Never Used  Substance and Sexual Activity  . Alcohol use: No  . Drug use: No  . Sexual activity: Yes    Birth control/protection: IUD  Lifestyle  . Physical activity:    Days per week: Not on file    Minutes per session: Not on file  . Stress: Not on file  Relationships  . Social connections:    Talks on phone: Not on file    Gets together: Not on file    Attends religious service: Not on file    Active member of club or organization: Not on file    Attends meetings of clubs or organizations: Not on file    Relationship status: Not on file  . Intimate partner violence:    Fear of current or ex partner: Not on file    Emotionally abused: Not on file    Physically abused: Not on file    Forced sexual activity: Not on file  Other Topics Concern  . 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 She lives in [place]. Her mother helps her with children.    Environmental History: The patient lives in a 34 year old house with carpeting throughout and central air/heat.  There is a cat in the home which does not have access to her bedroom.  She is a non-smoker.  There is no known mold/water damage in the home.  Allergies as of 03/24/2018      Reactions   Crab [shellfish Allergy] Rash      Medication List        Accurate as of 03/24/18  6:02 PM. Always use your most recent med list.          fluticasone 50 MCG/ACT nasal spray Commonly known as:  FLONASE Place 1 spray into both nostrils 2 (two) times daily.   hydrOXYzine 25 MG tablet Commonly known as:  ATARAX/VISTARIL Take 1-2 tablets (25-50 mg total) by mouth every 6 (six) hours as needed for itching.   levocetirizine 5 MG tablet Commonly known as:  XYZAL Take 1 tablet (5 mg total) by mouth every evening.   levonorgestrel 20 MCG/24HR IUD Commonly known as:  MIRENA 1 each by Intrauterine route once.   Olopatadine HCl 0.7 % Soln Commonly known as:  PAZEO Place  1 drop into both eyes daily as needed.   omeprazole 20 MG capsule Commonly known as:  PRILOSEC Take 1 capsule (20 mg total) by mouth daily.   ranitidine 150 MG tablet Commonly known as:  ZANTAC Take 1 tablet (150 mg total) by mouth 2 (two) times daily.       Known medication allergies: Allergies  Allergen Reactions  . Crab [Shellfish Allergy] Rash    I appreciate the opportunity to take part in Alberta's care. Please do not hesitate to contact me with questions.  Sincerely,   R. Jorene Guest, MD

## 2018-03-24 NOTE — Patient Instructions (Addendum)
Allergic urticaria The patient's history and skin test results suggest allergic urticaria secondary to pollen exposure vs urticaria associated with viral infection. Skin tests to select food allergens were negative today despite a positive histamine control.  The negative predictive value for food allergen skin testing is excellent, however, as she consumed crab legs within a few hours of symptom onset and has not eaten crab since that time, we will confirm skin test results with labs to be thorough. NSAIDs and emotional stress commonly exacerbate urticaria but are not the underlying etiology in this case. Physical urticarias are negative by history (i.e. pressure-induced, temperature, vibration, solar, etc.). History and lesions are not consistent with urticaria pigmentosa so I am not suspicious for mastocytosis. There are no concomitant symptoms concerning for anaphylaxis or constitutional symptoms worrisome for an underlying malignancy.   The following labs have been ordered: FCeRI antibody, anti-thyroglobulin antibody, thyroid peroxidase antibody, tryptase, serum specific IgE against shellfish panel and alpha gal panel.   The patient will be called with further recommendations after lab results have returned.  Should symptoms recur, restart H1/H2 receptor blockade with levocetirizine and ranitidine.  Should symptoms recur in the absence of pollen exposure, a journal is to be kept recording any foods eaten, beverages consumed, medications taken within a 6 hour period prior to the onset of symptoms, as well as record activities being performed, and environmental conditions. For any symptoms concerning for anaphylaxis, epinephrine is to be administered and 911 is to be called immediately.  A prescription has been provided for epinephrine autoinjector (Auvi-Q) 0.3 mg 2 pack.  Seasonal and perennial allergic rhinitis Aeroallergen skin test revealed robust reactivity to grass pollen and tree pollen.  She  is also sensitized to ragweed pollen, weed pollen, and molds.  Aeroallergen avoidance measures have been discussed and provided in written form.  Continue levocetirizine 5 mg daily if needed.  A prescription has been provided for fluticasone nasal spray, one spray per nostril 1-2 times daily as needed. Proper nasal spray technique has been discussed and demonstrated.  Nasal saline spray (i.e., Simply Saline) or nasal saline lavage (i.e., NeilMed) is recommended as needed and prior to medicated nasal sprays.  If allergen avoidance measures and medications fail to adequately relieve symptoms, aeroallergen immunotherapy will be considered.  Allergic conjunctivitis  Treatment plan as outlined above for allergic rhinitis.  A prescription has been provided for Pazeo, one drop per eye daily as needed.  I have also recommended eye lubricant drops (i.e., Natural Tears) as needed.   When lab results have returned the patient will be called with further recommendations and follow up instructions.  Urticaria (Hives)  . Levocetirizine (Xyzal) 5 mg twice a day and ranitidine (Zantac) 150 mg twice a day. If no symptoms for 7-14 days then decrease to. . Levocetirizine (Xyzal) 5 mg twice a day and ranitidine (Zantac) 150 mg once a day.  If no symptoms for 7-14 days then decrease to. . Levocetirizine (Xyzal) 5 mg twice a day.  If no symptoms for 7-14 days then decrease to. . Levocetirizine (Xyzal) 5 mg once a day.  May use Benadryl (diphenhydramine) as needed for breakthrough symptoms       If symptoms return, then step up dosage  Reducing Pollen Exposure  The American Academy of Allergy, Asthma and Immunology suggests the following steps to reduce your exposure to pollen during allergy seasons.    1. Do not hang sheets or clothing out to dry; pollen may collect on these items. 2. Do not  mow lawns or spend time around freshly cut grass; mowing stirs up pollen. 3. Keep windows closed at night.   Keep car windows closed while driving. 4. Minimize morning activities outdoors, a time when pollen counts are usually at their highest. 5. Stay indoors as much as possible when pollen counts or humidity is high and on windy days when pollen tends to remain in the air longer. 6. Use air conditioning when possible.  Many air conditioners have filters that trap the pollen spores. 7. Use a HEPA room air filter to remove pollen form the indoor air you breathe.   Control of Mold Allergen  Mold and fungi can grow on a variety of surfaces provided certain temperature and moisture conditions exist.  Outdoor molds grow on plants, decaying vegetation and soil.  The major outdoor mold, Alternaria and Cladosporium, are found in very high numbers during hot and dry conditions.  Generally, a late Summer - Fall peak is seen for common outdoor fungal spores.  Rain will temporarily lower outdoor mold spore count, but counts rise rapidly when the rainy period ends.  The most important indoor molds are Aspergillus and Penicillium.  Dark, humid and poorly ventilated basements are ideal sites for mold growth.  The next most common sites of mold growth are the bathroom and the kitchen.  Outdoor [employer] 2. Use air conditioning and keep windows closed 3. Avoid exposure to decaying vegetation. 4. Avoid leaf raking. 5. Avoid grain handling. 6. Consider wearing a face mask if working in moldy areas.  Indoor Mold Control 1. Maintain humidity below 50%. 2. Clean washable surfaces with 5% bleach solution. 3. Remove sources e.g. Contaminated carpets.

## 2018-03-25 ENCOUNTER — Other Ambulatory Visit: Payer: Self-pay

## 2018-03-25 MED ORDER — EPINEPHRINE 0.3 MG/0.3ML IJ SOAJ: 0.3000 mg | Freq: Once | INTRAMUSCULAR | 1 refills | Status: AC

## 2018-03-25 NOTE — Addendum Note (Signed)
Addended by: Mliss FritzBLACK, Zareena Willis I on: 03/25/2018 07:31 AM   Modules accepted: Orders

## 2018-03-27 ENCOUNTER — Telehealth: Payer: Self-pay | Admitting: *Deleted

## 2018-03-27 ENCOUNTER — Other Ambulatory Visit: Payer: Self-pay

## 2018-03-27 MED ORDER — EPINASTINE HCL 0.05 % OP SOLN: 1.0000 [drp] | OPHTHALMIC | 5 refills | Status: DC | PRN

## 2018-03-27 NOTE — Telephone Encounter (Signed)
Insurance does not Engineer, materialscover Pazeo. Azelastine HCl, Ketotifen Fumarate, and Epinastine HCl is preferred. Please advise.

## 2018-03-27 NOTE — Telephone Encounter (Signed)
Epinastine, 1 gtt OU bid prn. Thanks. 

## 2018-03-27 NOTE — Telephone Encounter (Signed)
Sent in rx.

## 2018-03-31 ENCOUNTER — Telehealth: Payer: Self-pay | Admitting: *Deleted

## 2018-03-31 ENCOUNTER — Other Ambulatory Visit: Payer: Self-pay | Admitting: *Deleted

## 2018-03-31 MED ORDER — OLOPATADINE HCL 0.2 % OP SOLN: 1.0000 [drp] | Freq: Every day | OPHTHALMIC | 5 refills | Status: DC

## 2018-03-31 NOTE — Telephone Encounter (Signed)
The prescription has been sent in. Thank You

## 2018-03-31 NOTE — Telephone Encounter (Signed)
The pharmacy states that the Pazeo is not covered and suggests trying Pataday or Patanol, will you please advise?

## 2018-03-31 NOTE — Telephone Encounter (Signed)
Pataday is fine.  1 drop per eye daily as needed.  Thanks.

## 2018-04-09 LAB — ALPHA-GAL PANEL
Alpha Gal IgE*: <0.1 kU/L (ref <0.10)
Beef (Bos spp) IgE: <0.1 kU/L (ref <0.35)
Class Interpretation: 0
Class Interpretation: 0
Class Interpretation: 0
Lamb/Mutton (Ovis spp) IgE: <0.1 kU/L (ref <0.35)
Pork (Sus spp) IgE: <0.1 kU/L (ref <0.35)

## 2018-04-09 LAB — TRYPTASE: Tryptase: 4.7 ug/L (ref 2.2–13.2)

## 2018-04-09 LAB — ALLERGEN PROFILE, SHELLFISH
Clam IgE: <0.1 kU/L
F023-IgE Crab: <0.1 kU/L
F080-IgE Lobster: <0.1 kU/L
F290-IgE Oyster: <0.1 kU/L
Scallop IgE: <0.1 kU/L
Shrimp IgE: <0.1 kU/L

## 2018-04-09 LAB — CHRONIC URTICARIA: cu index: <5.6 (ref <10)

## 2018-04-09 LAB — THYROID PEROXIDASE ANTIBODY: Thyroperoxidase Ab SerPl-aCnc: 10 IU/mL (ref 0–34)

## 2018-05-26 ENCOUNTER — Encounter: Payer: BLUE CROSS/BLUE SHIELD | Admitting: Family Medicine

## 2018-05-26 DIAGNOSIS — Z0289 Encounter for other administrative examinations: Secondary | ICD-10-CM

## 2018-09-29 ENCOUNTER — Encounter: Payer: Self-pay | Admitting: Family Medicine

## 2018-09-29 ENCOUNTER — Ambulatory Visit: Payer: BLUE CROSS/BLUE SHIELD | Admitting: Family Medicine

## 2018-09-29 VITALS — BP 101/67 | HR 77 | Temp 97.4°F | Resp 16 | Ht <= 58 in | Wt 116.0 lb

## 2018-09-29 DIAGNOSIS — B349 Viral infection, unspecified: Secondary | ICD-10-CM

## 2018-09-29 DIAGNOSIS — R69 Illness, unspecified: Secondary | ICD-10-CM | POA: Diagnosis not present

## 2018-09-29 DIAGNOSIS — K219 Gastro-esophageal reflux disease without esophagitis: Secondary | ICD-10-CM

## 2018-09-29 DIAGNOSIS — J111 Influenza due to unidentified influenza virus with other respiratory manifestations: Secondary | ICD-10-CM

## 2018-09-29 LAB — POCT INFLUENZA A/B
INFLUENZA B, POC: NEGATIVE
Influenza A, POC: NEGATIVE

## 2018-09-29 MED ORDER — OMEPRAZOLE 20 MG PO CPDR: 20.0000 mg | DELAYED_RELEASE_CAPSULE | Freq: Every day | ORAL | 0 refills | Status: DC

## 2018-09-29 NOTE — Progress Notes (Signed)
Cindy Thompson , 10-May-1986, 32 y.o., female MRN: [id number] Patient Care Team    Relationship Specialty Notifications Start End  Natalia LeatherwoodKuneff, Adalberto Metzgar A, DO PCP - General Family Medicine  04/11/16     Chief Complaint  Patient presents with  . Abdominal Pain    x3 days ago, ate spicy food/very hot in temperature,body aches, fatigue, chills.    Patient is present with interpreter today  Subjective: Pt presents for an OV with complaints of abdominal pain of 3 days duration.  Patient states that she had an abdominal pain that radiated through to her back x1 3 days ago.  Since that time she has had body aches, sore throat, chills and 4 episodes of diarrhea.  She had not had her flu shot this season.  She reports she is eating and drinking fine now.  The stomach pain just occurred once.  He does have a history of GERD, has not been taking her omeprazole or Zantac.  SHe is tolerating food and drink now.  She reports her symptoms occurred after eating a "hot pot "which is an ethnic food for her family in which many people eat out of the same pot.  She states no one else is ill.  [facility] 02/04/2018 02/18/2017 05/21/2016  Decreased Interest 0 0 0  Down, Depressed, Hopeless 0 0 0  PHQ - 2 Score 0 0 0    Allergies  Allergen Reactions  . Crab [Shellfish Allergy] Rash   Social History   Tobacco Use  . Smoking status: Never Smoker  . Smokeless tobacco: Never Used  Substance Use Topics  . Alcohol use: No   Past Medical History:  Diagnosis Date  . Allergy   . Angio-edema   . GERD (gastroesophageal reflux disease)   . Thyroid disease    thyroid nodules  . Urticaria    History reviewed. No pertinent surgical history. Family History  Problem Relation Age of Onset  . Suicidality Father   . Ovarian cancer Paternal Grandmother    Allergies as of 09/29/2018      Reactions   Crab [shellfish Allergy] Rash      Medication List       Accurate as of September 29, 2018 11:59 PM. Always  use your most recent med list.        fluticasone 50 MCG/ACT nasal spray Commonly known as:  FLONASE Place 1 spray into both nostrils 2 (two) times daily.   hydrOXYzine 25 MG tablet Commonly known as:  ATARAX/VISTARIL Take 1-2 tablets (25-50 mg total) by mouth every 6 (six) hours as needed for itching.   levocetirizine 5 MG tablet Commonly known as:  XYZAL Take 1 tablet (5 mg total) by mouth every evening.   levonorgestrel 20 MCG/24HR IUD Commonly known as:  MIRENA 1 each by Intrauterine route once.   Olopatadine HCl 0.2 % Soln Commonly known as:  PATADAY Place 1 drop into both eyes daily.   omeprazole 20 MG capsule Commonly known as:  PRILOSEC Take 1 capsule (20 mg total) by mouth daily.       All past medical history, surgical history, allergies, family history, immunizations andmedications were updated in the EMR today and reviewed under the history and medication portions of their EMR.     ROS: Negative, with the exception of above mentioned in HPI   Objective:  BP 101/67 (BP Location: Right Arm, Patient Position: Sitting, Cuff Size: Normal)   Pulse 77   Temp (!) 97.4 F (36.3  C) (Oral)   Resp 16   Ht 4' (1.219 m)   Wt 116 lb (52.6 kg)   SpO2 99%   BMI 35.40 kg/m  Body mass index is 35.4 kg/m. Gen: Afebrile. No acute distress. Nontoxic in appearance, well developed, well nourished.  Pleasant [place] female. HENT: AT. [place]. Bilateral TM visualized within normal limits. MMM, no oral lesions. Bilateral nares erythema, drainage present.. Throat without erythema or exudates.  No cough.  Hoarseness present. Eyes:Pupils Equal Round Reactive to light, Extraocular movements intact,  Conjunctiva without redness, discharge or icterus. Neck/lymp/endocrine: Supple, no lymphadenopathy CV: RRR Chest: CTAB, no wheeze or crackles. Good air movement, normal resp effort.  Abd: Soft.  Flat. NTND. BS present.  No masses palpated. No rebound or guarding.  Negative Murphy's sign,  negative McBurney's. Skin: no rashes, purpura or petechiae.    Results for orders placed or performed in visit on 09/29/18 (from the past 72 hour(s))  POCT Influenza A/B     Status: Normal   Collection Time: 09/29/18  2:39 PM  Result Value Ref Range   Influenza A, POC Negative Negative   Influenza B, POC Negative Negative    Assessment/Plan: Cindy Thompson is a 32 y.o. female present for OV for  Influenza-like illness/Viral illness -Rest, Hydrate hydrate hydrate.  Tylenol/Advil for body aches.  Mucinex DM OTC  -she likely is suffering from a viral illness-her influenza is negative.  Viral illness was discussed with her today.  SHe reports understanding through her interpreter.  Her abdominal exam is normal today.  The pain she described was once, could be related to gastroenteritis versus cholecystitis versus stomach ulcer.  Fortunately that pain has resolved.  The diarrhea has also resolved.  Body aches and fatigue remain, she was instructed that should improve within 7 days of illness onset. - POCT Influenza A/B--> negative today -Follow up in 1 week if no improvement in symptoms, sooner if worsening   Gastroesophageal reflux disease, esophagitis presence not specified Encouraged patient to restart her omeprazole-refills provided today   Reviewed expectations re: course of current medical issues.  Discussed self-management of symptoms.  Outlined signs and symptoms indicating need for more acute intervention.  Patient verbalized understanding and all questions were answered.  Patient received an After-Visit Summary.    Orders Placed This Encounter  Procedures  . POCT Influenza A/B     Note is dictated utilizing voice recognition software. Although note has been proof read prior to signing, occasional typographical errors still can be missed. If any questions arise, please do not hesitate to call for verification.   electronically signed by:  Felix Pacinienee Yennifer Segovia, DO  [facility] - [place]

## 2018-09-29 NOTE — Patient Instructions (Addendum)
REST, Hydrate Tylenol- advil  For bodyaches. No antibiotic needed- this is viral- but not influenza.  Mucinex DM  Restart your prilosec and zantac     Viral Illness, Adult Viruses are tiny germs that can get into a person's body and cause illness. There are many different types of viruses, and they cause many types of illness. Viral illnesses can range from mild to severe. They can affect various parts of the body. Common illnesses that are caused by a virus include colds and the flu. Viral illnesses also include serious conditions such as HIV/AIDS (human immunodeficiency virus/acquired immunodeficiency syndrome). A few viruses have been linked to certain cancers. What are the causes? Many types of viruses can cause illness. Viruses invade cells in your body, multiply, and cause the infected cells to malfunction or die. When the cell dies, it releases more of the virus. When this happens, you develop symptoms of the illness, and the virus continues to spread to other cells. If the virus takes over the function of the cell, it can cause the cell to divide and grow out of control, as is the case when a virus causes cancer. Different viruses get into the body in different ways. You can get a virus by:  Swallowing food or water that is contaminated with the virus.  Breathing in droplets that have been coughed or sneezed into the air by an infected person.  Touching a surface that has been contaminated with the virus and then touching your eyes, nose, or mouth.  Being bitten by an insect or animal that carries the virus.  Having sexual contact with a person who is infected with the virus.  Being exposed to blood or fluids that contain the virus, either through an open cut or during a transfusion. If a virus enters your body, your body's defense system (immune system) will try to fight the virus. You may be at higher risk for a viral illness if your immune system is weak. What are the signs or  symptoms? Symptoms vary depending on the type of virus and the location of the cells that it invades. Common symptoms of the main types of viral illnesses include: Cold and flu viruses  Fever.  Headache.  Sore throat.  Muscle aches.  Nasal congestion.  Cough. Digestive system (gastrointestinal) viruses  Fever.  Abdominal pain.  Nausea.  Diarrhea. Liver viruses (hepatitis)  Loss of appetite.  Tiredness.  Yellowing of the skin (jaundice). Brain and spinal cord viruses  Fever.  Headache.  Stiff neck.  Nausea and vomiting.  Confusion or sleepiness. Skin viruses  Warts.  Itching.  Rash. Sexually transmitted viruses  Discharge.  Swelling.  Redness.  Rash. How is this treated? Viruses can be difficult to treat because they live within cells. Antibiotic medicines do not treat viruses because these drugs do not get inside cells. Treatment for a viral illness may include:  Resting and drinking plenty of fluids.  Medicines to relieve symptoms. These can include over-the-counter medicine for pain and fever, medicines for cough or congestion, and medicines to relieve diarrhea.  Antiviral medicines. These drugs are available only for certain types of viruses. They may help reduce flu symptoms if taken early. There are also many antiviral medicines for hepatitis and HIV/AIDS. Some viral illnesses can be prevented with vaccinations. A common example is the flu shot. Follow these instructions at home: Medicines   Take over-the-counter and prescription medicines only as told by your health care provider.  If you were prescribed an  antiviral medicine, take it as told by your health care provider. Do not stop taking the medicine even if you start to feel better.  Be aware of when antibiotics are needed and when they are not needed. Antibiotics do not treat viruses. If your health care provider thinks that you may have a bacterial infection as well as a viral  infection, you may get an antibiotic. ? Do not ask for an antibiotic prescription if you have been diagnosed with a viral illness. That will not make your illness go away faster. ? Frequently taking antibiotics when they are not needed can lead to antibiotic resistance. When this develops, the medicine no longer works against the bacteria that it normally fights. General instructions  Drink enough fluids to keep your urine clear or pale yellow.  Rest as much as possible.  Return to your normal activities as told by your health care provider. Ask your health care provider what activities are safe for you.  Keep all follow-up visits as told by your health care provider. This is important. How is this prevented? Take these actions to reduce your risk of viral infection:  Eat a healthy diet and get enough rest.  Wash your hands often with soap and water. This is especially important when you are in public places. If soap and water are not available, use hand sanitizer.  Avoid close contact with friends and family who have a viral illness.  If you travel to areas where viral gastrointestinal infection is common, avoid drinking water or eating raw food.  Keep your immunizations up to date. Get a flu shot every year as told by your health care provider.  Do not share toothbrushes, nail clippers, razors, or needles with other people.  Always practice safe sex.  Contact a health care provider if:  You have symptoms of a viral illness that do not go away.  Your symptoms come back after going away.  Your symptoms get worse. Get help right away if:  You have trouble breathing.  You have a severe headache or a stiff neck.  You have severe vomiting or abdominal pain. This information is not intended to replace advice given to you by your health care provider. Make sure you discuss any questions you have with your health care provider. Document Released: 01/27/2016 Document Revised:  02/29/2016 Document Reviewed: 01/27/2016 Elsevier Interactive Patient Education  2019 [employer]

## 2018-09-30 ENCOUNTER — Encounter: Payer: Self-pay | Admitting: Family Medicine

## 2018-09-30 DIAGNOSIS — J209 Acute bronchitis, unspecified: Secondary | ICD-10-CM | POA: Diagnosis not present

## 2018-09-30 DIAGNOSIS — J069 Acute upper respiratory infection, unspecified: Secondary | ICD-10-CM | POA: Diagnosis not present

## 2018-09-30 DIAGNOSIS — J029 Acute pharyngitis, unspecified: Secondary | ICD-10-CM | POA: Diagnosis not present

## 2019-03-20 DIAGNOSIS — B001 Herpesviral vesicular dermatitis: Secondary | ICD-10-CM | POA: Diagnosis not present

## 2019-03-24 DIAGNOSIS — M272 Inflammatory conditions of jaws: Secondary | ICD-10-CM | POA: Diagnosis not present

## 2019-03-24 DIAGNOSIS — R001 Bradycardia, unspecified: Secondary | ICD-10-CM | POA: Diagnosis not present

## 2019-03-24 DIAGNOSIS — B001 Herpesviral vesicular dermatitis: Secondary | ICD-10-CM | POA: Diagnosis not present

## 2019-04-01 ENCOUNTER — Other Ambulatory Visit: Payer: Self-pay

## 2019-04-01 ENCOUNTER — Other Ambulatory Visit: Payer: BLUE CROSS/BLUE SHIELD

## 2019-04-01 DIAGNOSIS — Z20822 Contact with and (suspected) exposure to covid-19: Secondary | ICD-10-CM

## 2019-04-01 DIAGNOSIS — R6889 Other general symptoms and signs: Secondary | ICD-10-CM | POA: Diagnosis not present

## 2019-04-06 ENCOUNTER — Ambulatory Visit: Payer: Self-pay

## 2019-04-06 ENCOUNTER — Emergency Department (HOSPITAL_COMMUNITY)
Admission: EM | Admit: 2019-04-06 | Discharge: 2019-04-06 | Disposition: A | Discharge status: Home / Self Care | Payer: BC Managed Care – PPO | Attending: Emergency Medicine | Admitting: Emergency Medicine

## 2019-04-06 ENCOUNTER — Other Ambulatory Visit: Payer: Self-pay

## 2019-04-06 DIAGNOSIS — M7918 Myalgia, other site: Secondary | ICD-10-CM | POA: Diagnosis not present

## 2019-04-06 DIAGNOSIS — Z91013 Allergy to seafood: Secondary | ICD-10-CM | POA: Insufficient documentation

## 2019-04-06 DIAGNOSIS — Z79899 Other long term (current) drug therapy: Secondary | ICD-10-CM | POA: Insufficient documentation

## 2019-04-06 DIAGNOSIS — R509 Fever, unspecified: Secondary | ICD-10-CM | POA: Diagnosis not present

## 2019-04-06 DIAGNOSIS — U071 COVID-19: Secondary | ICD-10-CM | POA: Diagnosis not present

## 2019-04-06 LAB — GROUP A STREP BY PCR: Group A Strep by PCR: NOT DETECTED

## 2019-04-06 LAB — NOVEL CORONAVIRUS, NAA: SARS-CoV-2, NAA: DETECTED — AB

## 2019-04-06 NOTE — Telephone Encounter (Signed)
Out going call to Patient with complaint that Patient has ha a fever the last of last week . Temperature in normal now.  Complaint of white patches back of throat.  Patient is waiting for covid -19 results.  Is on quarantine.   Request a virtual appointment .  Reports body aches  when temperature was elevated.  Left message on voice mail .  Use of [place] interpreter is helpful.     Reason for Disposition . [1] Pus on tonsils (back of throat) AND [2]  fever AND [3] swollen neck lymph nodes ("glands")  Answer Assessment - Initial Assessment Questions 1. TEMPERATURE: "What is the most recent temperature?"  "How was it measured?"      102to 103 earlier 2. ONSET: "When did the fever start?"       3. SYMPTOMS: "Do you have any other symptoms besides the fever?"  (e.g., colds, headache, sore throat, earache, cough, rash, diarrhea, vomiting, abdominal pain)    Sore throat with white patches  4. CAUSE: If there are no symptoms, ask: "What do you think is causing the fever?"      5. CONTACTS: "Does anyone else in the family have an infection?"     *No Answer* 6. TREATMENT: "What have you done so far to treat this fever?" (e.g., medications)      7. IMMUNOCOMPROMISE: "Do you have of the following: diabetes, HIV positive, splenectomy, cancer chemotherapy, chronic steroid treatment, transplant patient, etc."     *No Answer* 8. PREGNANCY: "Is there any chance you are pregnant?" "When was your last menstrual period?"    Not ab le to remember.  9. TRAVEL: "Have you traveled out of the country in the last month?" (e.g., travel history, exposures)     no  Answer Assessment - Initial Assessment Questions 1. ONSET: "When did the throat start hurting?" (Hours or days ago)   3 day 2. SEVERITY: "How bad is the sore throat?" (Scale 1-10; mild, moderate or severe)   - MILD (1-3):  doesn't interfere with eating or normal activities   - MODERATE (4-7): interferes with eating some solids and normal activities  - SEVERE (8-10):  excruciating pain, interferes with most normal activities   - SEVERE DYSPHAGIA: can't swallow liquids, drooling     denies 3. STREP EXPOSURE: "Has there been any exposure to strep within the past week?" If so, ask: "What type of contact occurred?"      *No Answer* 4.  VIRAL SYMPTOMS: "Are there any symptoms of a cold, such as a runny nose, cough, hoarse voice or red eyes?"     runy nose 5. FEVER: "Do you have a fever?" If so, ask: "What is your temperature, how was it measured, and when did it start?"     *No Answer* 6. PUS ON THE TONSILS: "Is there pus on the tonsils in the back of your throat?"     Yes   7. OTHER SYMPTOMS: "Do you have any other symptoms?" (e.g., difficulty breathing, headache, rash)     *No Answer* 8. PREGNANCY: "Is there any chance you are pregnant?" "When was your last menstrual period?"     *No Answer*  Protocols used: SORE THROAT-A-AH, FEVER-A-AH

## 2019-04-06 NOTE — ED Triage Notes (Signed)
Pt states her husband tested positive x 11days ago and she states she was tested on July 1; pt states she is having a sore throat and body aches x 3 days; pt states she has been taking ibuprofen; pt states she has had a cough with some congestion

## 2019-04-06 NOTE — Discharge Instructions (Addendum)
Your Covid test performed last Wednesday has resulted and is positive.  You will need to stay home and self isolate yourself until 14 days has passed from the date of that test which will be July 14.  Treat any fever or sore throat with tylenol or motrin, get plenty of rest.  Return for any worsening symptoms such as shortness of breath or increased weakness.     Person Under Monitoring Name: Cindy Thompson  Location: [address] [place] [address]   Infection Prevention Recommendations for Individuals Confirmed to have, or Being Evaluated for, 2019 Novel Coronavirus (COVID-19) Infection Who Receive Care at Home  Individuals who are confirmed to have, or are being evaluated for, COVID-19 should follow the prevention steps below until a healthcare provider or local or state health department says they can return to normal activities.  Stay home except to get medical care You should restrict activities outside your home, except for getting medical care. Do not go to work, school, or public areas, and do not use public transportation or taxis.  Call ahead before visiting your doctor Before your medical appointment, call the healthcare provider and tell them that you have, or are being evaluated for, COVID-19 infection. This will help the healthcare providers office take steps to keep other people from getting infected. Ask your healthcare provider to call the local or state health department.  Monitor your symptoms Seek prompt medical attention if your illness is worsening (e.g., difficulty breathing). Before going to your medical appointment, call the healthcare provider and tell them that you have, or are being evaluated for, COVID-19 infection. Ask your healthcare provider to call the local or state health department.  Wear a facemask You should wear a facemask that covers your nose and mouth when you are in the same room with other people and when you visit a healthcare provider.  People who live with or visit you should also wear a facemask while they are in the same room with you.  Separate yourself from other people in your home As much as possible, you should stay in a different room from other people in your home. Also, you should use a separate bathroom, if available.  Avoid sharing household items You should not share dishes, drinking glasses, cups, eating utensils, towels, bedding, or other items with other people in your home. After using these items, you should wash them thoroughly with soap and water.  Cover your coughs and sneezes Cover your mouth and nose with a tissue when you cough or sneeze, or you can cough or sneeze into your sleeve. Throw used tissues in a lined trash can, and immediately wash your hands with soap and water for at least 20 seconds or use an alcohol-based hand rub.  Wash your [facility] your hands often and thoroughly with soap and water for at least 20 seconds. You can use an alcohol-based hand sanitizer if soap and water are not available and if your hands are not visibly dirty. Avoid touching your eyes, nose, and mouth with unwashed hands.   Prevention Steps for Caregivers and Household Members of Individuals Confirmed to have, or Being Evaluated for, COVID-19 Infection Being Cared for in the Home  If you live with, or provide care at home for, a person confirmed to have, or being evaluated for, COVID-19 infection please follow these guidelines to prevent infection:  Follow healthcare providers instructions Make sure that you understand and can help the patient follow any healthcare provider instructions for  all care.  Provide for the patients basic needs You should help the patient with basic needs in the home and provide support for getting groceries, prescriptions, and other personal needs.  Monitor the patients symptoms If they are getting sicker, call his or her medical provider and tell them that the patient  has, or is being evaluated for, COVID-19 infection. This will help the healthcare providers office take steps to keep other people from getting infected. Ask the healthcare provider to call the local or state health department.  Limit the number of people who have contact with the patient If possible, have only one caregiver for the patient. Other household members should stay in another home or place of residence. If this is not possible, they should stay in another room, or be separated from the patient as much as possible. Use a separate bathroom, if available. Restrict visitors who do not have an essential need to be in the home.  Keep older adults, very young children, and other sick people away from the patient Keep older adults, very young children, and those who have compromised immune systems or chronic health conditions away from the patient. This includes people with chronic heart, lung, or kidney conditions, diabetes, and cancer.  Ensure good ventilation Make sure that shared spaces in the home have good air flow, such as from an air conditioner or an opened window, weather permitting.  Wash your hands often Wash your hands often and thoroughly with soap and water for at least 20 seconds. You can use an alcohol based hand sanitizer if soap and water are not available and if your hands are not visibly dirty. Avoid touching your eyes, nose, and mouth with unwashed hands. Use disposable paper towels to dry your hands. If not available, use dedicated cloth towels and replace them when they become wet.  Wear a facemask and gloves Wear a disposable facemask at all times in the room and gloves when you touch or have contact with the patients blood, body fluids, and/or secretions or excretions, such as sweat, saliva, sputum, nasal mucus, vomit, urine, or feces.  Ensure the mask fits over your nose and mouth tightly, and do not touch it during use. Throw out disposable facemasks and  gloves after using them. Do not reuse. Wash your hands immediately after removing your facemask and gloves. If your personal clothing becomes contaminated, carefully remove clothing and launder. Wash your hands after handling contaminated clothing. Place all used disposable facemasks, gloves, and other waste in a lined container before disposing them with other household waste. Remove gloves and wash your hands immediately after handling these items.  Do not share dishes, glasses, or other household items with the patient Avoid sharing household items. You should not share dishes, drinking glasses, cups, eating utensils, towels, bedding, or other items with a patient who is confirmed to have, or being evaluated for, COVID-19 infection. After the person uses these items, you should wash them thoroughly with soap and water.  Wash laundry thoroughly Immediately remove and wash clothes or bedding that have blood, body fluids, and/or secretions or excretions, such as sweat, saliva, sputum, nasal mucus, vomit, urine, or feces, on them. Wear gloves when handling laundry from the patient. Read and follow directions on labels of laundry or clothing items and detergent. In general, wash and dry with the warmest temperatures recommended on the label.  Clean all areas the individual has used often Clean all touchable surfaces, such as counters, tabletops, doorknobs, bathroom fixtures, toilets,  phones, keyboards, tablets, and bedside tables, every day. Also, clean any surfaces that may have blood, body fluids, and/or secretions or excretions on them. Wear gloves when cleaning surfaces the patient has come in contact with. Use a diluted bleach solution (e.g., dilute bleach with 1 part bleach and 10 parts water) or a household disinfectant with a label that says EPA-registered for coronaviruses. To make a bleach solution at home, add 1 tablespoon of bleach to 1 quart (4 cups) of water. For a larger supply, add   cup of bleach to 1 gallon (16 cups) of water. Read labels of cleaning products and follow recommendations provided on product labels. Labels contain instructions for safe and effective use of the cleaning product including precautions you should take when applying the product, such as wearing gloves or eye protection and making sure you have good ventilation during use of the product. Remove gloves and wash hands immediately after cleaning.  Monitor yourself for signs and symptoms of illness Caregivers and household members are considered close contacts, should monitor their health, and will be asked to limit movement outside of the home to the extent possible. Follow the monitoring steps for close contacts listed on the symptom monitoring form.   ? If you have additional questions, contact your local health department or call the epidemiologist on call at [phone] (available 24/7). ? This guidance is subject to change. For the most up-to-date guidance from [facility], please refer to their website: [email or web address]

## 2019-04-06 NOTE — ED Provider Notes (Signed)
[facility] EMERGENCY DEPARTMENT Provider Note   CSN: [id number] Arrival date & time: 04/06/19  1613     History   Chief Complaint Chief Complaint  Patient presents with  . Generalized Body Aches    HPI Cindy Thompson is a 33 y.o. female with history of allergies, GERD, urticaria and known exposure to Covid as her husband was diagnosed 11 days ago.  She presents today with main complaint of body aches, sore throat, nasal congestion, mild dry cough without sob or cp and subjective fevers which have been present for the past 3 days. She is frustrated because she was tested for Covid on July 1 at the drive by testing site here in [place] and is still awaiting her test result, although her children who were tested the same (and sx free) have resulted with negative findings.  She wants to return to work but does not know if she can.  She has taken ibuprofen with sx relief.     The history is provided by the patient. The history is limited by a language barrier. A language interpreter was used.    Past Medical History:  Diagnosis Date  . Allergy   . Angio-edema   . GERD (gastroesophageal reflux disease)   . Thyroid disease    thyroid nodules  . Urticaria     Patient Active Problem List   Diagnosis Date Noted  . Allergic urticaria 02/04/2018  . Lateral epicondylitis of right elbow 11/18/2017  . IUD (intrauterine device) in place 02/18/2017  . Chronic seasonal allergic rhinitis due to pollen 11/26/2016  . Acne vulgaris 03/04/2015  . Gastroesophageal reflux disease 07/12/2014  . Constipation 12/21/2013    No past surgical history on file.   OB History    Gravida  4   Para  3   Term  3   Preterm      AB  1   Living  3     SAB  1   TAB      Ectopic      Multiple      Live Births  3            Home Medications    Prior to Admission medications   Medication Sig Start Date End Date Taking? Authorizing Provider  fluticasone (FLONASE) 50 MCG/ACT nasal spray  Place 1 spray into both nostrils 2 (two) times daily. 03/24/18   Bobbitt, Heywood Ilesalph Carter, MD  hydrOXYzine (ATARAX/VISTARIL) 25 MG tablet Take 1-2 tablets (25-50 mg total) by mouth every 6 (six) hours as needed for itching. 02/03/18   Gilda CreasePollina, Christopher J, MD  levocetirizine (XYZAL) 5 MG tablet Take 1 tablet (5 mg total) by mouth every evening. 02/04/18   Kuneff, Renee A, DO  levonorgestrel (MIRENA) 20 MCG/24HR IUD 1 each by Intrauterine route once.    [provider]  Olopatadine HCl (PATADAY) 0.2 % SOLN Place 1 drop into both eyes daily. 03/31/18   Bobbitt, Heywood Ilesalph Carter, MD  omeprazole (PRILOSEC) 20 MG capsule Take 1 capsule (20 mg total) by mouth daily. 09/29/18   Natalia LeatherwoodKuneff, Renee A, DO    Family History Family History  Problem Relation Age of Onset  . Suicidality Father   . Ovarian cancer Paternal Grandmother     Social History Social History   Tobacco Use  . Smoking status: Never Smoker  . Smokeless tobacco: Never Used  Substance Use Topics  . Alcohol use: No  . Drug use: No     Allergies   Crab [  shellfish allergy]   Review of Systems Review of Systems  Constitutional: Positive for fever. Negative for chills.  HENT: Positive for congestion and sore throat. Negative for ear pain, rhinorrhea, sinus pressure, trouble swallowing and voice change.   Eyes: Negative for discharge.  Respiratory: Positive for cough. Negative for shortness of breath, wheezing and stridor.   Cardiovascular: Negative for chest pain.  Gastrointestinal: Negative for abdominal pain, diarrhea, nausea and vomiting.  Genitourinary: Negative.  Negative for dysuria.  Musculoskeletal: Positive for myalgias.  Neurological: Negative for weakness.     Physical Exam Updated Vital Signs BP 116/85 (BP Location: Right Arm)   Pulse 79   Temp 98.2 F (36.8 C) (Oral)   Resp 16   Ht 5' (1.524 m)   Wt 49.9 kg   LMP 03/09/2019   SpO2 100%   BMI 21.48 kg/m   Physical Exam Constitutional:      General:  She is not in acute distress.    Appearance: Normal appearance. She is well-developed. She is not ill-appearing.  HENT:     Head: Normocephalic and atraumatic.     Right Ear: Tympanic membrane and ear canal normal.     Left Ear: Tympanic membrane and ear canal normal.     Nose: Mucosal edema and congestion present.     Mouth/Throat:     Mouth: Mucous membranes are moist.     Pharynx: Uvula midline. Posterior oropharyngeal erythema present. No oropharyngeal exudate.     Tonsils: No tonsillar abscesses.  Eyes:     Conjunctiva/sclera: Conjunctivae normal.  Cardiovascular:     Rate and Rhythm: Normal rate.     Heart sounds: Normal heart sounds.  Pulmonary:     Effort: Pulmonary effort is normal. No respiratory distress.     Breath sounds: No stridor. No wheezing, rhonchi or rales.  Abdominal:     Palpations: Abdomen is soft.     Tenderness: There is no abdominal tenderness.  Musculoskeletal: Normal range of motion.  Skin:    General: Skin is warm and dry.     Findings: No rash.  Neurological:     Mental Status: She is alert and oriented to person, place, and time.      ED Treatments / Results  Labs (all labs ordered are listed, but only abnormal results are displayed) Labs Reviewed  GROUP A STREP BY PCR    EKG None  Radiology No results found.  Procedures Procedures (including critical care time)  Medications Ordered in ED Medications - No data to display   Initial Impression / Assessment and Plan / ED Course  I have reviewed the triage vital signs and the nursing notes.  Pertinent labs & imaging results that were available during my care of the patient were reviewed by me and considered in my medical decision making (see chart for details).        During pt's ed visit, her outpt covid test resulted positive.  She was advised on home care, rest, continued ibuprofen.  Return precautions discussed.  Home quarantine also discussed needing to maintain this for 14  days from the date of her testing, through July 14, advised contact pcp if sx persist beyond this date. Return precautions discussed.   Cindy Thompson was evaluated in Emergency Department on 04/07/2019 for the symptoms described in the history of present illness. She was evaluated in the context of the global COVID-19 pandemic, which necessitated consideration that the patient might be at risk for infection with the SARS-CoV-2 virus that  causes COVID-19. Institutional protocols and algorithms that pertain to the evaluation of patients at risk for COVID-19 are in a state of rapid change based on information released by regulatory bodies including the CDC and federal and state organizations. These policies and algorithms were followed during the patient's care in the ED.   Final Clinical Impressions(s) / ED Diagnoses   Final diagnoses:  COVID-19 virus infection    ED Discharge Orders    None       Victoriano Laindol, Angello Chien, PA-C 04/07/19 1305    Loren RacerYelverton, David, MD 04/09/19 1505

## 2019-04-06 NOTE — Telephone Encounter (Signed)
Virtual appt tomorrow 7/7 [place]

## 2019-04-07 ENCOUNTER — Ambulatory Visit (INDEPENDENT_AMBULATORY_CARE_PROVIDER_SITE_OTHER): Payer: BC Managed Care – PPO | Admitting: Family Medicine

## 2019-04-07 ENCOUNTER — Encounter: Payer: Self-pay | Admitting: Family Medicine

## 2019-04-07 VITALS — Temp 98.2°F

## 2019-04-07 DIAGNOSIS — U071 COVID-19: Secondary | ICD-10-CM | POA: Diagnosis not present

## 2019-04-07 DIAGNOSIS — B342 Coronavirus infection, unspecified: Secondary | ICD-10-CM

## 2019-04-07 MED ORDER — IBUPROFEN 600 MG PO TABS: ORAL_TABLET | ORAL | 0 refills | Status: DC

## 2019-04-07 NOTE — Progress Notes (Signed)
Virtual Visit via Video Note  I connected with pt on 04/07/19 at  8:30 AM EDT by telephone nd verified that I am speaking with the correct person using two identifiers.  Location patient: home Location provider:work or home office Persons participating in the virtual visit: patient, [place]arin [place] interpreter, and myself.  I discussed the limitations of evaluation and management by telemedicine/telephone and the availability of in person appointments. The patient expressed understanding and agreed to proceed.   HPI: 33 y/o Asian female being seen today for sore throat.  Interpreter helping but despite this fact the history taking process was very difficult. Pt went to [facility] ED yesterday and tested positive for coronavirus.  I verified this in lab section of EMR today.  Her husband recently had coronavirus + and is now recovered (he did not have serious illness or complications). She presented there with a fever, productive cough, some intermittent nausea, some ST initially.  No SOB/Dyspnea. Onset of illness was approx 04/01/19. Most recent fever was 3 d/a--> yes, she has been on tylenol and ibuprofen.  ROS: See pertinent positives and negatives per HPI.  Past Medical History:  Diagnosis Date  . Allergy   . Angio-edema   . GERD (gastroesophageal reflux disease)   . Thyroid disease    thyroid nodules  . Urticaria     No past surgical history on file.  Family History  Problem Relation Age of Onset  . Suicidality Father   . Ovarian cancer Paternal Grandmother        Current Outpatient Medications:  .  fluticasone (FLONASE) 50 MCG/ACT nasal spray, Place 1 spray into both nostrils 2 (two) times daily., Disp: 16 g, Rfl: 5 .  hydrOXYzine (ATARAX/VISTARIL) 25 MG tablet, Take 1-2 tablets (25-50 mg total) by mouth every 6 (six) hours as needed for itching., Disp: 30 tablet, Rfl: 0 .  levocetirizine (XYZAL) 5 MG tablet, Take 1 tablet (5 mg total) by mouth every evening., Disp: 30  tablet, Rfl: 11 .  levonorgestrel (MIRENA) 20 MCG/24HR IUD, 1 each by Intrauterine route once., Disp: , Rfl:  .  Olopatadine HCl (PATADAY) 0.2 % SOLN, Place 1 drop into both eyes daily. (Patient not taking: Reported on 04/07/2019), Disp: 1 Bottle, Rfl: 5 .  omeprazole (PRILOSEC) 20 MG capsule, Take 1 capsule (20 mg total) by mouth daily. (Patient not taking: Reported on 04/07/2019), Disp: 90 capsule, Rfl: 0  EXAM:  VITALS per patient if applicable: Temp 53.6 F (36.8 C) (Oral)   LMP 03/09/2019  alert, oriented, pleasant, lucid thought and speech. No further exam today b/c it was a telephone encounter.  LABS: none today    Chemistry      Component Value Date/Time   NA 140 08/17/2016 1406   K 4.2 08/17/2016 1406   CL 102 08/17/2016 1406   CO2 30 08/17/2016 1406   BUN 12 08/17/2016 1406   CREATININE 0.64 08/17/2016 1406      Component Value Date/Time   CALCIUM 9.8 08/17/2016 1406   ALKPHOS 41 08/17/2016 1406   AST 13 08/17/2016 1406   ALT 10 08/17/2016 1406   BILITOT 0.9 08/17/2016 1406     Lab Results  Component Value Date   WBC 6.7 08/17/2016   HGB 13.4 08/17/2016   HCT 39.1 08/17/2016   MCV 93.6 08/17/2016   PLT 212.0 08/17/2016    ASSESSMENT AND PLAN:  Discussed the following assessment and plan:  Covid 19 viral infection, improving symptoms.  She has been self quarantining for  the last 11 days. Symptomatic care discussed.  Ibup has been helping her sx's.  We discussed quarantine recommendations. She is feeling better and duration of illness so far has been 7 d.  However, she has not been 3d w/out fever while not on antipyretic yet.  We discussed this specific criteria and she expressed understanding. I did eRx ibuprofen 600mg  to use q6h prn FEVER ONLY (T>100.4), NOT for any other reason. Push fluids.    I discussed the assessment and treatment plan with the patient. The patient was provided an opportunity to ask questions and all were answered. The patient agreed  with the plan and demonstrated an understanding of the instructions.   The patient was advised to call back or seek an in-person evaluation if the symptoms worsen or if the condition fails to improve as anticipated.  Spent 20 min with pt today, with >50% of this time spent in counseling and care coordination regarding the above problems.  F/u: call if sx's worsen or new questions.  To ED if SOB or suspicion of dehydration.  Signed:  Santiago BumpersPhil Allah Reason, MD           04/07/2019

## 2019-04-09 ENCOUNTER — Emergency Department (HOSPITAL_COMMUNITY)
Admission: EM | Admit: 2019-04-09 | Discharge: 2019-04-09 | Disposition: A | Discharge status: Home / Self Care | Payer: BC Managed Care – PPO | Attending: Emergency Medicine | Admitting: Emergency Medicine

## 2019-04-09 ENCOUNTER — Emergency Department (HOSPITAL_COMMUNITY): Payer: BC Managed Care – PPO

## 2019-04-09 ENCOUNTER — Encounter (HOSPITAL_COMMUNITY): Payer: Self-pay

## 2019-04-09 ENCOUNTER — Other Ambulatory Visit: Payer: Self-pay

## 2019-04-09 ENCOUNTER — Ambulatory Visit: Payer: Self-pay | Admitting: Family Medicine

## 2019-04-09 DIAGNOSIS — R0602 Shortness of breath: Secondary | ICD-10-CM | POA: Diagnosis not present

## 2019-04-09 DIAGNOSIS — U071 COVID-19: Secondary | ICD-10-CM | POA: Diagnosis not present

## 2019-04-09 DIAGNOSIS — Z79899 Other long term (current) drug therapy: Secondary | ICD-10-CM | POA: Diagnosis not present

## 2019-04-09 DIAGNOSIS — R05 Cough: Secondary | ICD-10-CM | POA: Diagnosis not present

## 2019-04-09 MED ORDER — ACETAMINOPHEN 325 MG PO TABS
650.0000 mg | ORAL_TABLET | Freq: Once | ORAL | Status: AC
  Administered 2019-04-09: 650 mg via ORAL
  Filled 2019-04-09: qty 2

## 2019-04-09 MED ORDER — ONDANSETRON 4 MG PO TBDP: 4.0000 mg | ORAL_TABLET | Freq: Three times a day (TID) | ORAL | 0 refills | Status: DC | PRN

## 2019-04-09 NOTE — Telephone Encounter (Signed)
Forward to Kuneff Clinical Team °

## 2019-04-09 NOTE — ED Triage Notes (Signed)
Pt has been having difficulty breathing for the last 2 days. No fevers. Tested positive for COVID 8 days ago. States SOB when she's walking and moving around. Has been taking Ibuprofen and Tylenol. Yesterday tried a nasal spray for congestion.

## 2019-04-09 NOTE — Telephone Encounter (Signed)
Pt reports tested positive for covid 19 04/01/2019. States "Felt good until today." States SOB "With walking" and at rest."Can't take deep breath."  Also reports coughing up "Red blood." Denies fever. Pt directed to ED, verbalizes understanding states will follow disposition.  Reason for Disposition . Difficulty breathing  Answer Assessment - Initial Assessment Questions 1. ONSET: "When did you start coughing up blood?"     today 2. SEVERITY: "How many times?" "How much blood?" (e.g., flecks, streaks, tablespoons, etc)     unsure 3. COUGHING SPASMS: "Did the blood appear after a coughing spell?"      no 4. RESPIRATORY DISTRESS: "Describe your breathing."      SOB 5. FEVER: "Do you have a fever?" If so, ask: "What is your temperature, how was it measured, and when did it start?"     no 6. SPUTUM: "Describe the color of your sputum" (clear, white, yellow, green), "Has there been any change recently?"     "Milky with red blood" 7. CARDIAC HISTORY: "Do you have any history of heart disease?" (e.g., heart attack, congestive heart failure)      *No Answer* 8. LUNG HISTORY: "Do you have any history of lung disease?"  (e.g., pulmonary embolus, asthma, emphysema)     *No Answer* 9. PE RISK FACTORS: "Do you have a history of blood clots?" (or: recent major surgery, recent prolonged travel, bedridden)     *No Answer* 10. OTHER SYMPTOMS: "Do you have any other symptoms?" (e.g., nosebleed, chest pain, abdominal pain, vomiting)      Tested positive for covid 19 04/01/2019  Protocols used: COUGHING UP BLOOD-A-AH

## 2019-04-09 NOTE — ED Provider Notes (Signed)
[facility] EMERGENCY DEPARTMENT Provider Note   CSN: [id number] Arrival date & time: 04/09/19  1416    History   Chief Complaint Chief Complaint  Patient presents with  . Shortness of Breath    HPI Cindy Thompson is a 33 y.o. female who presents with SOB. PMH significant for allergies. She states that she was diagnosed with COVID on 7/1 but initially didn't have any symptoms. Over the past couple days she has had a fever, coughing, and SOB. The SOB is worse with exertion and deep breaths. It's better with lying down. She states she hasn't had a fever for a couple days. She is coughing up a little sputum that is red in color. She also has a sore throat. She was tested for strep and that was negative. She denies any chest pain, abdominal pain, vomiting, leg swelling. She is having some nausea but has been drinking a lot of water.     HPI  Past Medical History:  Diagnosis Date  . Allergy   . Angio-edema   . GERD (gastroesophageal reflux disease)   . Thyroid disease    thyroid nodules  . Urticaria     Patient Active Problem List   Diagnosis Date Noted  . Allergic urticaria 02/04/2018  . Lateral epicondylitis of right elbow 11/18/2017  . IUD (intrauterine device) in place 02/18/2017  . Chronic seasonal allergic rhinitis due to pollen 11/26/2016  . Acne vulgaris 03/04/2015  . Gastroesophageal reflux disease 07/12/2014  . Constipation 12/21/2013    History reviewed. No pertinent surgical history.   OB History    Gravida  4   Para  3   Term  3   Preterm      AB  1   Living  3     SAB  1   TAB      Ectopic      Multiple      Live Births  3            Home Medications    Prior to Admission medications   Medication Sig Start Date End Date Taking? Authorizing Provider  fluticasone (FLONASE) 50 MCG/ACT nasal spray Place 1 spray into both nostrils 2 (two) times daily. 03/24/18   Bobbitt, Sedalia Muta, MD  hydrOXYzine (ATARAX/VISTARIL) 25 MG tablet Take 1-2  tablets (25-50 mg total) by mouth every 6 (six) hours as needed for itching. 02/03/18   Orpah Greek, MD  ibuprofen (ADVIL) 600 MG tablet Take 1 tab every 6 hours with food FOR FEVER 04/07/19   McGowen, Adrian Blackwater, MD  levocetirizine (XYZAL) 5 MG tablet Take 1 tablet (5 mg total) by mouth every evening. 02/04/18   Kuneff, Renee A, DO  levonorgestrel (MIRENA) 20 MCG/24HR IUD 1 each by Intrauterine route once.    [provider]  Olopatadine HCl (PATADAY) 0.2 % SOLN Place 1 drop into both eyes daily. Patient not taking: Reported on 04/07/2019 03/31/18   Bobbitt, Sedalia Muta, MD  omeprazole (PRILOSEC) 20 MG capsule Take 1 capsule (20 mg total) by mouth daily. Patient not taking: Reported on 04/07/2019 09/29/18   Ma Hillock, DO    Family History Family History  Problem Relation Age of Onset  . Suicidality Father   . Ovarian cancer Paternal Grandmother     Social History Social History   Tobacco Use  . Smoking status: Never Smoker  . Smokeless tobacco: Never Used  Substance Use Topics  . Alcohol use: No  . Drug use: No  Allergies   Crab [shellfish allergy]   Review of Systems Review of Systems  Constitutional: Positive for fever.  Respiratory: Positive for cough and shortness of breath.   Cardiovascular: Negative for chest pain and leg swelling.  Gastrointestinal: Positive for nausea. Negative for abdominal pain and vomiting.  All other systems reviewed and are negative.    Physical Exam Updated Vital Signs BP 116/63   Pulse 76   Temp 98 F (36.7 C) (Oral)   Resp 18   Ht 5' (1.524 m)   Wt 49.9 kg   LMP 03/15/2019   SpO2 98%   BMI 21.48 kg/m   Physical Exam Vitals signs and nursing note reviewed.  Constitutional:      General: She is not in acute distress.    Appearance: Normal appearance. She is well-developed.  HENT:     Head: Normocephalic and atraumatic.     Mouth/Throat:     Mouth: Mucous membranes are moist.     Pharynx: Posterior  oropharyngeal erythema present.  Eyes:     General: No scleral icterus.       Right eye: No discharge.        Left eye: No discharge.     Conjunctiva/sclera: Conjunctivae normal.     Pupils: Pupils are equal, round, and reactive to light.  Neck:     Musculoskeletal: Normal range of motion.  Cardiovascular:     Rate and Rhythm: Normal rate and regular rhythm.  Pulmonary:     Effort: Pulmonary effort is normal. No respiratory distress.     Breath sounds: Normal breath sounds.  Abdominal:     General: There is no distension.     Palpations: Abdomen is soft.     Tenderness: There is no abdominal tenderness.  Musculoskeletal:     Right lower leg: No edema.     Left lower leg: No edema.  Skin:    General: Skin is warm and dry.  Neurological:     Mental Status: She is alert and oriented to person, place, and time.  Psychiatric:        Behavior: Behavior normal.      ED Treatments / Results  Labs (all labs ordered are listed, but only abnormal results are displayed) Labs Reviewed - No data to display  EKG None  [facility]  Result Date: 04/09/2019 CLINICAL DATA:  Shortness of breath, cough. EXAM: PORTABLE CHEST 1 VIEW COMPARISON:  Radiographs of April 22, 2014. FINDINGS: The heart size and mediastinal contours are within normal limits. Both lungs are clear. No pneumothorax or pleural effusion is noted. The visualized skeletal structures are unremarkable. IMPRESSION: No active disease. Electronically Signed   By: Lupita RaiderJames  Green Jr M.D.   On: 04/09/2019 15:12    Procedures Procedures (including critical care time)  Medications Ordered in ED Medications  acetaminophen (TYLENOL) tablet 650 mg (has no administration in time range)     Initial Impression / Assessment and Plan / ED Course  I have reviewed the triage vital signs and the nursing notes.  Pertinent labs & imaging results that were available during my care of the patient were reviewed by me and  considered in my medical decision making (see chart for details).  33 year old female presents with shortness of breath.  She has known COVID 19.  Her vital signs are normal here and she is overall well-appearing.  Symptoms are consistent with ongoing infection.  Chest x-ray was obtained and is negative.  Low suspicion for  PE at this time.  Advised continuing supportive care with rest, fluids, self isolation.  Zofran prescription given for nausea.  She is encouraged to follow-up with her doctor and return for worsening symptoms.  Final Clinical Impressions(s) / ED Diagnoses   Final diagnoses:  COVID-19  Shortness of breath    ED Discharge Orders    None       Bethel BornGekas, Zeya Balles Marie, PA-C 04/09/19 1634    Bethann BerkshireZammit, Joseph, MD 04/09/19 1911

## 2019-04-09 NOTE — Discharge Instructions (Signed)
Please continue to rest and drink plenty of fluids Take Tylenol or Ibuprofen for fever or pain Take Zofran for nausea Return if you are worsening (more trouble breathing, weak, vomiting)

## 2019-04-10 ENCOUNTER — Encounter: Payer: Self-pay | Admitting: Family Medicine

## 2019-04-10 DIAGNOSIS — R042 Hemoptysis: Secondary | ICD-10-CM | POA: Insufficient documentation

## 2019-04-10 DIAGNOSIS — U071 COVID-19: Secondary | ICD-10-CM

## 2019-04-10 HISTORY — DX: COVID-19: U07.1

## 2019-04-21 ENCOUNTER — Encounter: Payer: Self-pay | Admitting: Family Medicine

## 2019-04-21 ENCOUNTER — Other Ambulatory Visit: Payer: Self-pay

## 2019-04-21 ENCOUNTER — Ambulatory Visit (INDEPENDENT_AMBULATORY_CARE_PROVIDER_SITE_OTHER): Payer: BC Managed Care – PPO | Admitting: Family Medicine

## 2019-04-21 VITALS — Temp 97.5°F | Ht 60.0 in | Wt 105.0 lb

## 2019-04-21 DIAGNOSIS — G479 Sleep disorder, unspecified: Secondary | ICD-10-CM | POA: Insufficient documentation

## 2019-04-21 DIAGNOSIS — U071 COVID-19: Secondary | ICD-10-CM | POA: Diagnosis not present

## 2019-04-21 HISTORY — DX: Sleep disorder, unspecified: G47.9

## 2019-04-21 MED ORDER — HYDROXYZINE PAMOATE 25 MG PO CAPS: 25.0000 mg | ORAL_CAPSULE | Freq: Every day | ORAL | 1 refills | Status: DC

## 2019-04-21 NOTE — Progress Notes (Signed)
Telephone VISIT   I connected with Cindy Thompson on 04/21/19 at 10:30 AM EDT by telephone- verified that I am speaking with the correct person using two identifiers. Location patient: Home Location provider: [facility], Office Persons participating in the virtual visit: Patient, Dr. Raoul Pitch and R.Baker, LPN  I discussed the limitations of evaluation and management by telemedicine and the availability of in person appointments. The patient expressed understanding and agreed to proceed.  Translator Levada Dy on the call for assistance.    Cindy Thompson , May 02, 1986, 33 y.o., female MRN: [id number] Patient Care Team    Relationship Specialty Notifications Start End  Ma Hillock, DO PCP - General Family Medicine  04/11/16     Chief Complaint  Patient presents with  . Follow-up     Subjective: Pt presents for an OV for follow up after testing covid 19 positive on 04/01/2019. Her husband also tested positive. Her children have been negative. She denies cough, shortness of breath, fever, chills or GI complaints for > 3 days. She was seen in the ED 2 additional times on 7/6 and 04/09/2019 for worsening symptoms and discharge same day. CXR was completed and normal.   Depression screen [facility] 2/9 02/04/2018 02/18/2017 05/21/2016  Decreased Interest 0 0 0  Down, Depressed, Hopeless 0 0 0  PHQ - 2 Score 0 0 0    Allergies  Allergen Reactions  . 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 She lives in [place]. Her mother helps her with children.    Past Medical History:  Diagnosis Date  . Allergy   . Angio-edema   . GERD (gastroesophageal reflux disease)   . Thyroid disease    thyroid nodules  . Urticaria    No past surgical history on file. Family History  Problem Relation Age of Onset  . Suicidality Father   . Ovarian cancer Paternal Grandmother    Allergies as of 04/21/2019    Reactions   Crab [shellfish Allergy] Rash      Medication List       Accurate as of April 21, 2019 10:39 AM. If you have any questions, ask your nurse or doctor.        fluticasone 50 MCG/ACT nasal spray Commonly known as: FLONASE Place 1 spray into both nostrils 2 (two) times daily.   hydrOXYzine 25 MG tablet Commonly known as: ATARAX/VISTARIL Take 1-2 tablets (25-50 mg total) by mouth every 6 (six) hours as needed for itching.   ibuprofen 600 MG tablet Commonly known as: ADVIL Take 1 tab every 6 hours with food FOR FEVER   levocetirizine 5 MG tablet Commonly known as: Xyzal Take 1 tablet (5 mg total) by mouth every evening.   levonorgestrel 20 MCG/24HR IUD Commonly known as: MIRENA 1 each by Intrauterine route once.   Olopatadine HCl 0.2 % Soln Commonly known as: Pataday Place 1 drop into both eyes daily.   omeprazole 20 MG capsule Commonly known as: PRILOSEC Take 1 capsule (20 mg total) by mouth daily.   ondansetron 4 MG disintegrating tablet Commonly known as: Zofran ODT Take 1 tablet (4 mg total) by mouth every 8 (eight) hours as needed for nausea or vomiting.       All past medical history, surgical history, allergies, family history, immunizations andmedications were updated in the EMR today and reviewed under the history and medication portions of  their EMR.     ROS: Negative, with the exception of above mentioned in HPI   Objective:  Temp (!) 97.5 F (36.4 C)   Ht 5' (1.524 m)   Wt 105 lb (47.6 kg)   BMI 20.51 kg/m  Body mass index is 20.51 kg/m. Gen:  No acute distress. Nontoxic Chest: no cough or shortness of breath Skin: no rashes, purpura or petechiae.  Neuro:   Alert. Oriented x3  Psych: Normal affect and demeanor. Normal speech. Normal thought content and judgment.  No exam data present No results found. No results found for this or any previous visit (from the past 24 hour(s)).  Assessment/Plan: Cindy Thompson is a 33 y.o. female present  for OV for  COVID-19 virus detected Virus detected July 1. She has been symptom free for > 3 days. She may return to work.  Sleep disturbance She is having increased anxiety and difficulty falling asleep secondary to pandemic. She was covid positive and so was her husband. Her children were negative- but have since developed a cough and she constantly worries when she is trying to fall asleep.  - discussed options- She would like to try vistaril 25 mg Qhs.  - f/u PRN   Reviewed expectations re: course of current medical issues.  Discussed self-management of symptoms.  Outlined signs and symptoms indicating need for more acute intervention.  Patient verbalized understanding and all questions were answered.  Patient received an After-Visit Summary.    No orders of the defined types were placed in this encounter.  > 25 minutes spent with patient via phone with translator     Note is dictated utilizing voice recognition software. Although note has been proof read prior to signing, occasional typographical errors still can be missed. If any questions arise, please do not hesitate to call for verification.   electronically signed by:  Felix Pacinienee Kingstin Heims, DO  [facility] - [place]

## 2019-04-21 NOTE — Progress Notes (Signed)
I have discussed the procedure for the virtual visit with the patient who has given consent to proceed with assessment and treatment.   BETHANY DILLARD, CMA     

## 2019-04-23 ENCOUNTER — Other Ambulatory Visit: Payer: Self-pay

## 2019-04-23 ENCOUNTER — Other Ambulatory Visit: Payer: BC Managed Care – PPO

## 2019-04-23 DIAGNOSIS — Z20822 Contact with and (suspected) exposure to covid-19: Secondary | ICD-10-CM

## 2019-04-23 DIAGNOSIS — R6889 Other general symptoms and signs: Secondary | ICD-10-CM | POA: Diagnosis not present

## 2019-04-26 LAB — NOVEL CORONAVIRUS, NAA: SARS-CoV-2, NAA: NOT DETECTED

## 2019-04-29 ENCOUNTER — Telehealth: Payer: Self-pay | Admitting: *Deleted

## 2019-04-29 NOTE — Telephone Encounter (Signed)
Pt called for results of covid-19. Negative results given. Pt not having symptoms. She voiced understanding. Will route to her provider for review.

## 2019-04-29 NOTE — Telephone Encounter (Signed)
Reviewed. However, this was not ordered by this provider.

## 2019-06-06 ENCOUNTER — Other Ambulatory Visit: Payer: Self-pay | Admitting: Family Medicine

## 2019-09-09 ENCOUNTER — Telehealth: Payer: Self-pay | Admitting: Family Medicine

## 2019-09-09 NOTE — Telephone Encounter (Signed)
Patient is requesting Rx for Omeprazol sent to [facility].

## 2019-09-09 NOTE — Telephone Encounter (Signed)
Pt was called and VV was made. Pt was told we could not refill med until appt. She verbalized understanding

## 2019-09-14 ENCOUNTER — Other Ambulatory Visit: Payer: Self-pay

## 2019-09-14 ENCOUNTER — Encounter: Payer: Self-pay | Admitting: Family Medicine

## 2019-09-14 ENCOUNTER — Ambulatory Visit (INDEPENDENT_AMBULATORY_CARE_PROVIDER_SITE_OTHER): Payer: BC Managed Care – PPO | Admitting: Family Medicine

## 2019-09-14 VITALS — BP 95/60 | HR 67 | Temp 98.0°F | Resp 16 | Ht 60.0 in

## 2019-09-14 DIAGNOSIS — K219 Gastro-esophageal reflux disease without esophagitis: Secondary | ICD-10-CM | POA: Diagnosis not present

## 2019-09-14 MED ORDER — OMEPRAZOLE 40 MG PO CPDR: 40.0000 mg | DELAYED_RELEASE_CAPSULE | Freq: Every day | ORAL | 3 refills | Status: DC

## 2019-09-14 NOTE — Patient Instructions (Signed)
Gastroesophageal Reflux Disease, Adult Gastroesophageal reflux (GER) happens when acid from the stomach flows up into the tube that connects the mouth and the stomach (esophagus). Normally, food travels down the esophagus and stays in the stomach to be digested. However, when a person has GER, food and stomach acid sometimes move back up into the esophagus. If this becomes a more serious problem, the person may be diagnosed with a disease called gastroesophageal reflux disease (GERD). GERD occurs when the reflux:  Happens often.  Causes frequent or severe symptoms.  Causes problems such as damage to the esophagus. When stomach acid comes in contact with the esophagus, the acid may cause soreness (inflammation) in the esophagus. Over time, GERD may create small holes (ulcers) in the lining of the esophagus. What are the causes? This condition is caused by a problem with the muscle between the esophagus and the stomach (lower esophageal sphincter, or LES). Normally, the LES muscle closes after food passes through the esophagus to the stomach. When the LES is weakened or abnormal, it does not close properly, and that allows food and stomach acid to go back up into the esophagus. The LES can be weakened by certain dietary substances, medicines, and medical conditions, including:  Tobacco use.  Pregnancy.  Having a hiatal hernia.  Alcohol use.  Certain foods and beverages, such as coffee, chocolate, onions, and peppermint. What increases the risk? You are more likely to develop this condition if you:  Have an increased body weight.  Have a connective tissue disorder.  Use NSAID medicines. What are the signs or symptoms? Symptoms of this condition include:  Heartburn.  Difficult or painful swallowing.  The feeling of having a lump in the throat.  Abitter taste in the mouth.  Bad breath.  Having a large amount of saliva.  Having an upset or bloated stomach.  Belching.   Chest pain. Different conditions can cause chest pain. Make sure you see your health care provider if you experience chest pain.  Shortness of breath or wheezing.  Ongoing (chronic) cough or a night-time cough.  Wearing away of tooth enamel.  Weight loss. How is this diagnosed? Your health care provider will take a medical history and perform a physical exam. To determine if you have mild or severe GERD, your health care provider may also monitor how you respond to treatment. You may also have tests, including:  A test to examine your stomach and esophagus with a small camera (endoscopy).  A test thatmeasures the acidity level in your esophagus.  A test thatmeasures how much pressure is on your esophagus.  A barium swallow or modified barium swallow test to show the shape, size, and functioning of your esophagus. How is this treated? The goal of treatment is to help relieve your symptoms and to prevent complications. Treatment for this condition may vary depending on how severe your symptoms are. Your health care provider may recommend:  Changes to your diet.  Medicine.  Surgery. Follow these instructions at home: Eating and drinking   Follow a diet as recommended by your health care provider. This may involve avoiding foods and drinks such as: ? Coffee and tea (with or without caffeine). ? Drinks that containalcohol. ? Energy drinks and sports drinks. ? Carbonated drinks or sodas. ? Chocolate and cocoa. ? Peppermint and mint flavorings. ? Garlic and onions. ? Horseradish. ? Spicy and acidic foods, including peppers, chili powder, curry powder, vinegar, hot sauces, and barbecue sauce. ? Citrus fruit juices and citrus   fruits, such as oranges, lemons, and limes. ? Tomato-based foods, such as red sauce, chili, salsa, and pizza with red sauce. ? Fried and fatty foods, such as donuts, french fries, potato chips, and high-fat dressings. ? High-fat meats, such as hot dogs and  fatty cuts of red and white meats, such as rib eye steak, sausage, ham, and bacon. ? High-fat dairy items, such as whole milk, butter, and cream cheese.  Eat small, frequent meals instead of large meals.  Avoid drinking large amounts of liquid with your meals.  Avoid eating meals during the 2-3 hours before bedtime.  Avoid lying down right after you eat.  Do not exercise right after you eat. Lifestyle   Do not use any products that contain nicotine or tobacco, such as cigarettes, e-cigarettes, and chewing tobacco. If you need help quitting, ask your health care provider.  Try to reduce your stress by using methods such as yoga or meditation. If you need help reducing stress, ask your health care provider.  If you are overweight, reduce your weight to an amount that is healthy for you. Ask your health care provider for guidance about a safe weight loss goal. General instructions  Pay attention to any changes in your symptoms.  Take over-the-counter and prescription medicines only as told by your health care provider. Do not take aspirin, ibuprofen, or other NSAIDs unless your health care provider told you to do so.  Wear loose-fitting clothing. Do not wear anything tight around your waist that causes pressure on your abdomen.  Raise (elevate) the head of your bed about 6 inches (15 cm).  Avoid bending over if this makes your symptoms worse.  Keep all follow-up visits as told by your health care provider. This is important. Contact a health care provider if:  You have: ? New symptoms. ? Unexplained weight loss. ? Difficulty swallowing or it hurts to swallow. ? Wheezing or a persistent cough. ? A hoarse voice.  Your symptoms do not improve with treatment. Get help right away if you:  Have pain in your arms, neck, jaw, teeth, or back.  Feel sweaty, dizzy, or light-headed.  Have chest pain or shortness of breath.  Vomit and your vomit looks like blood or coffee grounds.   Faint.  Have stool that is bloody or black.  Cannot swallow, drink, or eat. Summary  Gastroesophageal reflux happens when acid from the stomach flows up into the esophagus. GERD is a disease in which the reflux happens often, causes frequent or severe symptoms, or causes problems such as damage to the esophagus.  Treatment for this condition may vary depending on how severe your symptoms are. Your health care provider may recommend diet and lifestyle changes, medicine, or surgery.  Contact a health care provider if you have new or worsening symptoms.  Take over-the-counter and prescription medicines only as told by your health care provider. Do not take aspirin, ibuprofen, or other NSAIDs unless your health care provider told you to do so.  Keep all follow-up visits as told by your health care provider. This is important. This information is not intended to replace advice given to you by your health care provider. Make sure you discuss any questions you have with your health care provider. Document Released: 06/27/2005 Document Revised: 03/26/2018 Document Reviewed: 03/26/2018 Elsevier Patient Education  2020 Elsevier Inc.  

## 2019-09-14 NOTE — Progress Notes (Signed)
     SUBJECTIVE Chief Complaint  Patient presents with  . Gastroesophageal Reflux    Pt would like RX for GERD. Pt would like Omeprazole.     HPI: Cindy Thompson is a 33 y.o. female present for recurrent GERD sx. She has been prescribed omeprazole in the past. She states she has not needed the omeprazole much. However, if she eats something spicy or deep fried her symptoms return. She would like refills on medication.   ROS: See pertinent positives and negatives per HPI.  Patient Active Problem List   Diagnosis Date Noted  . Sleep disturbance 04/21/2019  . COVID-19 virus detected 04/10/2019  . Hemoptysis 04/10/2019  . Allergic urticaria 02/04/2018  . Lateral epicondylitis of right elbow 11/18/2017  . IUD (intrauterine device) in place 02/18/2017  . Chronic seasonal allergic rhinitis due to pollen 11/26/2016  . Acne vulgaris 03/04/2015  . Gastroesophageal reflux disease 07/12/2014  . Constipation 12/21/2013    Social History   Tobacco Use  . Smoking status: Never Smoker  . Smokeless tobacco: Never Used  Substance Use Topics  . Alcohol use: No    Current Outpatient Medications:  .  fluticasone (FLONASE) 50 MCG/ACT nasal spray, Place 1 spray into both nostrils 2 (two) times daily., Disp: 16 g, Rfl: 5 .  hydrOXYzine (VISTARIL) 25 MG capsule, Take 1 capsule (25 mg total) by mouth at bedtime., Disp: 90 capsule, Rfl: 1 .  levonorgestrel (MIRENA) 20 MCG/24HR IUD, 1 each by Intrauterine route once., Disp: , Rfl:  .  Olopatadine HCl (PATADAY) 0.2 % SOLN, Place 1 drop into both eyes daily., Disp: 1 Bottle, Rfl: 5  Allergies  Allergen Reactions  . Crab [Shellfish Allergy] Rash    OBJECTIVE: Temp 98 F (36.7 C) (Oral)   Ht 5' (1.524 m)   LMP 08/31/2019   BMI 20.51 kg/m  Gen: No acute distress. Nontoxic in appearance.  HENT: AT. [place].  MMM.  Eyes:Pupils Equal Round Reactive to light, Extraocular movements intact,  Conjunctiva without redness, discharge or icterus. Chest: Cough or  shortness of breath not present  Neuro:  Alert. Oriented x3  Psych: Normal affect, dress and demeanor. Normal speech. Normal thought content and judgment.  ASSESSMENT AND PLAN: Cindy Thompson is a 33 y.o. female present for  Gastroesophageal reflux disease, unspecified whether esophagitis present - stable. Still requiring omeprazole intermittently.  - omeprazole 40 mg qd prescribed.  - f/u yearly.   > 15 minutes spent with patient, > 50% of that time face to face   Howard Pouch, DO 09/14/2019

## 2019-11-24 ENCOUNTER — Telehealth: Payer: Self-pay

## 2019-11-24 MED ORDER — OMEPRAZOLE 20 MG PO CPDR: 40.0000 mg | DELAYED_RELEASE_CAPSULE | Freq: Every day | ORAL | 1 refills | Status: DC

## 2019-11-24 NOTE — Addendum Note (Signed)
Addended by: Felix Pacini A on: 11/24/2019 02:25 PM   Modules accepted: Orders

## 2019-11-24 NOTE — Telephone Encounter (Signed)
Patient states 40mg  of Omeprazole to big, it is hard to swallow. She is requesting to 20 mg, and take 2 of the pills. Her next fill is due on 12/13/19   omeprazole (PRILOSEC)    [facility] - [place], [place]   Patient can be reached at [phone]

## 2019-11-24 NOTE — Telephone Encounter (Addendum)
Changes made -refills called in.

## 2019-11-24 NOTE — Telephone Encounter (Signed)
Pharmacy was called and asked if the 20mg  capsule was going to be smaller in size and she said it depends on the manufacturer. They can get this smaller for patient in the 20mg . Pt was called and she said she wants to try the 20mg  capsules. Pt was advised insurance may not cover the 20mg  capsules.   Can her RX be changed to 20mg  capsules? Please advise

## 2019-12-18 ENCOUNTER — Telehealth: Payer: Self-pay

## 2019-12-18 NOTE — Telephone Encounter (Signed)
Patient allergic to seafood - wants to know if she can take the COVID vaccine.  Please advise [phone]

## 2019-12-18 NOTE — Telephone Encounter (Signed)
Pt was called and advised to check My chart for recommendations and how to get vaccine. My chart message sent with information. She was advised to ask prior to getting injection and answer the screening questions they ask correctly. Pt advised she will have to wait additional time after getting due to allergies. Pt verbalized understanding

## 2019-12-31 ENCOUNTER — Ambulatory Visit: Payer: Self-pay

## 2020-01-26 ENCOUNTER — Encounter: Payer: Self-pay | Admitting: Family Medicine

## 2021-03-06 ENCOUNTER — Other Ambulatory Visit: Payer: Self-pay

## 2021-03-06 ENCOUNTER — Ambulatory Visit (INDEPENDENT_AMBULATORY_CARE_PROVIDER_SITE_OTHER): Payer: 59 | Admitting: Family Medicine

## 2021-03-06 ENCOUNTER — Encounter: Payer: Self-pay | Admitting: Family Medicine

## 2021-03-06 VITALS — BP 92/60 | HR 67 | Temp 98.1°F | Ht 59.75 in | Wt 105.0 lb

## 2021-03-06 DIAGNOSIS — L308 Other specified dermatitis: Secondary | ICD-10-CM

## 2021-03-06 DIAGNOSIS — Z130 Encounter for screening for diseases of the blood and blood-forming organs and certain disorders involving the immune mechanism: Secondary | ICD-10-CM

## 2021-03-06 DIAGNOSIS — Z79899 Other long term (current) drug therapy: Secondary | ICD-10-CM | POA: Diagnosis not present

## 2021-03-06 DIAGNOSIS — Z0000 Encounter for general adult medical examination without abnormal findings: Secondary | ICD-10-CM | POA: Diagnosis not present

## 2021-03-06 DIAGNOSIS — K219 Gastro-esophageal reflux disease without esophagitis: Secondary | ICD-10-CM

## 2021-03-06 DIAGNOSIS — L309 Dermatitis, unspecified: Secondary | ICD-10-CM | POA: Insufficient documentation

## 2021-03-06 DIAGNOSIS — Z1159 Encounter for screening for other viral diseases: Secondary | ICD-10-CM

## 2021-03-06 DIAGNOSIS — Z13220 Encounter for screening for lipoid disorders: Secondary | ICD-10-CM

## 2021-03-06 DIAGNOSIS — Z5181 Encounter for therapeutic drug level monitoring: Secondary | ICD-10-CM | POA: Diagnosis not present

## 2021-03-06 DIAGNOSIS — Z131 Encounter for screening for diabetes mellitus: Secondary | ICD-10-CM | POA: Diagnosis not present

## 2021-03-06 HISTORY — DX: Dermatitis, unspecified: L30.9

## 2021-03-06 LAB — CBC WITH DIFFERENTIAL/PLATELET
Basophils Absolute: 0 10*3/uL (ref 0.0–0.1)
Basophils Relative: 0.7 % (ref 0.0–3.0)
Eosinophils Absolute: 0.1 10*3/uL (ref 0.0–0.7)
Eosinophils Relative: 0.8 % (ref 0.0–5.0)
HCT: 38 % (ref 36.0–46.0)
Hemoglobin: 13 g/dL (ref 12.0–15.0)
Lymphocytes Relative: 37 % (ref 12.0–46.0)
Lymphs Abs: 2.4 10*3/uL (ref 0.7–4.0)
MCHC: 34.3 g/dL (ref 30.0–36.0)
MCV: 94.6 fl (ref 78.0–100.0)
Monocytes Absolute: 0.5 10*3/uL (ref 0.1–1.0)
Monocytes Relative: 7.3 % (ref 3.0–12.0)
Neutro Abs: 3.6 10*3/uL (ref 1.4–7.7)
Neutrophils Relative %: 54.2 % (ref 43.0–77.0)
Platelets: 196 10*3/uL (ref 150.0–400.0)
RBC: 4.01 Mil/uL (ref 3.87–5.11)
RDW: 12.2 % (ref 11.5–15.5)
WBC: 6.6 10*3/uL (ref 4.0–10.5)

## 2021-03-06 LAB — HEMOGLOBIN A1C: Hgb A1c MFr Bld: 5.4 % (ref 4.6–6.5)

## 2021-03-06 LAB — VITAMIN D 25 HYDROXY (VIT D DEFICIENCY, FRACTURES): VITD: 24.48 ng/mL — ABNORMAL LOW (ref 30.00–100.00)

## 2021-03-06 LAB — VITAMIN B12: Vitamin B-12: 530 pg/mL (ref 211–911)

## 2021-03-06 MED ORDER — TRIAMCINOLONE ACETONIDE 0.1 % EX CREA: 1.0000 "application " | TOPICAL_CREAM | Freq: Two times a day (BID) | CUTANEOUS | 5 refills | Status: DC

## 2021-03-06 MED ORDER — OMEPRAZOLE 20 MG PO CPDR: 40.0000 mg | DELAYED_RELEASE_CAPSULE | Freq: Every day | ORAL | 1 refills | Status: DC

## 2021-03-06 MED ORDER — LEVOCETIRIZINE DIHYDROCHLORIDE 5 MG PO TABS: 5.0000 mg | ORAL_TABLET | Freq: Every evening | ORAL | 11 refills | Status: DC

## 2021-03-06 NOTE — Progress Notes (Signed)
This visit occurred during the SARS-CoV-2 public health emergency.  Safety protocols were in place, including screening questions prior to the visit, additional usage of staff PPE, and extensive cleaning of exam room while observing appropriate contact time as indicated for disinfecting solutions.    Patient ID: Cindy Thompson, female  DOB: September 20, 1986, 35 y.o.   MRN: [id number] Patient Care Team    Relationship Specialty Notifications Start End  Natalia Leatherwood, DO PCP - General Family Medicine  04/11/16   Cheral Marker, CNM Midwife Obstetrics and Gynecology  03/06/21     Chief Complaint  Patient presents with  . Annual Exam    Pt is not fasting     Subjective: Cindy Thompson is a 35 y.o.  Female  present for CPE. All past medical history, surgical history, allergies, family history, immunizations, medications and social history were updated in the electronic medical record today. All recent labs, ED visits and hospitalizations within the last year were reviewed.  Health maintenance:  Colonoscopy: screen at 45, no fhx Mammogram: start screen at 40, no fhx Cervical cancer screening: completed with GYN; 01/2016. Genella Rife CNM Immunizations: tdap 2014, Influenza (encouraged yearly), covid x3 completed Infectious disease screening: HIV completed 2015; hep c collected today DEXA: screen at 58 Assistive device: none Oxygen ONG:EXBM Patient has a Dental home. Hospitalizations/ED visits: reviewed  Depression screen [facility] 2/9 03/06/2021 02/04/2018 02/18/2017 05/21/2016  Decreased Interest 0 0 0 0  Down, Depressed, Hopeless 0 0 0 0  PHQ - 2 Score 0 0 0 0   No flowsheet data found.   Immunization History  Administered Date(s) Administered  . Influenza,inj,Quad PF,6+ Mos 07/12/2014  . Influenza,inj,quad, With Preservative 07/20/2019  . PFIZER(Purple Top)SARS-COV-2 Vaccination 12/31/2019, 01/22/2020, 09/09/2020  . Tdap 01/13/2013   Past Medical History:  Diagnosis Date  . Allergic urticaria  02/04/2018  . Allergy   . Angio-edema   . COVID-19 virus detected 04/10/2019   W/ sob and hemoptysis   . GERD (gastroesophageal reflux disease)   . Lateral epicondylitis of right elbow 11/18/2017  . Thyroid disease    thyroid nodules  . Urticaria    Allergies  Allergen Reactions  . Crab [Shellfish Allergy] Rash   History reviewed. No pertinent surgical history. Family History  Problem Relation Age of Onset  . Suicidality Father   . 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 She lives in [place]. Her mother helps her with children.     Allergies as of 03/06/2021      Reactions   Crab [shellfish Allergy] Rash      Medication List       Accurate as of March 06, 2021  1:30 PM. If you have any questions, ask your nurse or doctor.        STOP taking these medications   fluticasone 50 MCG/ACT nasal spray Commonly known as: FLONASE Stopped by: Felix Pacini, DO   hydrOXYzine 25 MG capsule Commonly known as: Vistaril Stopped by: Felix Pacini, DO     TAKE these medications   levocetirizine 5 MG tablet Commonly known as: XYZAL Take 1 tablet (5 mg total) by mouth every evening. Started by: Felix Pacini, DO   levonorgestrel 20 MCG/24HR IUD Commonly known as: MIRENA 1 each by Intrauterine route once.   Olopatadine HCl 0.2 % Soln Commonly known as: Pataday Place 1 drop into both eyes daily.  omeprazole 20 MG capsule Commonly known as: PRILOSEC Take 2 capsules (40 mg total) by mouth daily.   triamcinolone cream 0.1 % Commonly known as: KENALOG Apply 1 application topically 2 (two) times daily. Started by: Felix Pacini, DO       All past medical history, surgical history, allergies, family history, immunizations andmedications were updated in the EMR today and reviewed under the history and medication portions of their EMR.     No results found for this or any  previous visit (from the past 2160 hour(s)).   ROS: 14 pt review of systems performed and negative (unless mentioned in an HPI)  Objective: BP 92/60   Pulse 67   Temp 98.1 F (36.7 C) (Oral)   Ht 4' 11.75" (1.518 m)   Wt 105 lb (47.6 kg)   SpO2 100%   BMI 20.68 kg/m  Gen: Afebrile. No acute distress. Nontoxic in appearance, well-developed, well-nourished,  Very pleasant female.  HENT: AT. [place]. Bilateral TM visualized and normal in appearance, normal external auditory canal. MMM, no oral lesions, adequate dentition. Bilateral nares within normal limits. Throat without erythema, ulcerations or exudates. no Cough on exam, no hoarseness on exam. Eyes:Pupils Equal Round Reactive to light, Extraocular movements intact,  Conjunctiva without redness, discharge or icterus. Neck/lymp/endocrine: Supple,no lymphadenopathy, no thyromegaly CV: RRR no murmur, no edema, +2/4 P posterior tibialis pulses.  Chest: CTAB, no wheeze, rhonchi or crackles. normal Respiratory effort. good Air movement. Abd: Soft. flat. NTND. BS present. no Masses palpated. No hepatosplenomegaly. No rebound tenderness or guarding. Skin: no rashes, purpura or petechiae. Warm and well-perfused. Skin intact. Neuro/Msk: Normal gait. PERLA. EOMi. Alert. Oriented x3.  Cranial nerves II through XII intact. Muscle strength 5/5 upper/lower extremity. DTRs equal bilaterally. Psych: Normal affect, dress and demeanor. Normal speech. Normal thought content and judgment.   No exam data present  Assessment/plan: Cindy Thompson is a 35 y.o. female present for CPE  Gastroesophageal reflux disease, unspecified whether esophagitis present - stable. Still requiring omeprazole intermittently.  - continue omeprazole 40 mg qd prescribed.  - f/u yearly.  Eczema:  Kenalog cream prescribed.  Encounter for monitoring long-term proton pump inhibitor therapy - Magnesium - B12 - Vitamin D (25 hydroxy) Screening for deficiency anemia - CBC with  Differential/Platelet Diabetes mellitus screening - Comprehensive metabolic panel - Hemoglobin A1c Lipid screening - Lipid panel  Need for hepatitis C screening test - Hepatitis C antibody Routine general medical examination at a health care facility Colonoscopy: screen at 45, no fhx Mammogram: start screen at 40, no fhx Cervical cancer screening: completed with GYN; 01/2016. Genella Rife CNM Immunizations: tdap 2014, Influenza (encouraged yearly), covid x3 completed Infectious disease screening: HIV completed 2015; hep c collected today DEXA: screen at 58 Patient was encouraged to exercise greater than 150 minutes a week. Patient was encouraged to choose a diet filled with fresh fruits and vegetables, and lean meats. AVS provided to patient today for education/recommendation on gender specific health and safety maintenance.  Return in about 1 year (around 03/06/2022) for CPE (30 min).  Orders Placed This Encounter  Procedures  . CBC with Differential/Platelet  . Comprehensive metabolic panel  . Lipid panel  . Hemoglobin A1c  . Hepatitis C antibody  . Magnesium  . B12  . Vitamin D (25 hydroxy)   Meds ordered this encounter  Medications  . omeprazole (PRILOSEC) 20 MG capsule    Sig: Take 2 capsules (40 mg total) by mouth daily.    Dispense:  180 capsule  Refill:  1    Please DC prior 40 mg prescription. Patient reports pills are too large to swallow.  . triamcinolone cream (KENALOG) 0.1 %    Sig: Apply 1 application topically 2 (two) times daily.    Dispense:  45 g    Refill:  5  . levocetirizine (XYZAL) 5 MG tablet    Sig: Take 1 tablet (5 mg total) by mouth every evening.    Dispense:  30 tablet    Refill:  11   Referral Orders  No referral(s) requested today     Electronically signed by: Felix Pacini, DO [facility] [place]

## 2021-03-06 NOTE — Patient Instructions (Addendum)
Great to see you today.  I have refilled the medication(s) we provide.   If labs were collected, we will inform you of lab results once received either by echart message or telephone call.   - echart message- for normal results that have been seen by the patient already.   - telephone call: abnormal results or if patient has not viewed results in their echart.    Health Maintenance, Female Adopting a healthy lifestyle and getting preventive care are important in promoting health and wellness. Ask your health care provider about:  The right schedule for you to have regular tests and exams.  Things you can do on your own to prevent diseases and keep yourself healthy. What should I know about diet, weight, and exercise? Eat a healthy diet  Eat a diet that includes plenty of vegetables, fruits, low-fat dairy products, and lean protein.  Do not eat a lot of foods that are high in solid fats, added sugars, or sodium.   Maintain a healthy weight Body mass index (BMI) is used to identify weight problems. It estimates body fat based on height and weight. Your health care provider can help determine your BMI and help you achieve or maintain a healthy weight. Get regular exercise Get regular exercise. This is one of the most important things you can do for your health. Most adults should:  Exercise for at least 150 minutes each week. The exercise should increase your heart rate and make you sweat (moderate-intensity exercise).  Do strengthening exercises at least twice a week. This is in addition to the moderate-intensity exercise.  Spend less time sitting. Even light physical activity can be beneficial. Watch cholesterol and blood lipids Have your blood tested for lipids and cholesterol at 37 years of age, then have this test every 5 years. Have your cholesterol levels checked more often if:  Your lipid or cholesterol levels are high.  You are older than 37 years of age.  You are at  high risk for heart disease. What should I know about cancer screening? Depending on your health history and family history, you may need to have cancer screening at various ages. This may include screening for:  Breast cancer.  Cervical cancer.  Colorectal cancer.  Skin cancer.  Lung cancer. What should I know about heart disease, diabetes, and high blood pressure? Blood pressure and heart disease  High blood pressure causes heart disease and increases the risk of stroke. This is more likely to develop in people who have high blood pressure readings, are of African descent, or are overweight.  Have your blood pressure checked: ? Every 3-5 years if you are 18-39 years of age. ? Every year if you are 40 years old or older. Diabetes Have regular diabetes screenings. This checks your fasting blood sugar level. Have the screening done:  Once every three years after age 40 if you are at a normal weight and have a low risk for diabetes.  More often and at a younger age if you are overweight or have a high risk for diabetes. What should I know about preventing infection? Hepatitis B If you have a higher risk for hepatitis B, you should be screened for this virus. Talk with your health care provider to find out if you are at risk for hepatitis B infection. Hepatitis C Testing is recommended for:  Everyone born from 1945 through 1965.  Anyone with known risk factors for hepatitis C. Sexually transmitted infections (STIs)  Get screened for   STIs, including gonorrhea and chlamydia, if: ? You are sexually active and are younger than 37 years of age. ? You are older than 37 years of age and your health care provider tells you that you are at risk for this type of infection. ? Your sexual activity has changed since you were last screened, and you are at increased risk for chlamydia or gonorrhea. Ask your health care provider if you are at risk.  Ask your health care provider about whether  you are at high risk for HIV. Your health care provider may recommend a prescription medicine to help prevent HIV infection. If you choose to take medicine to prevent HIV, you should first get tested for HIV. You should then be tested every 3 months for as long as you are taking the medicine. Pregnancy  If you are about to stop having your period (premenopausal) and you may become pregnant, seek counseling before you get pregnant.  Take 400 to 800 micrograms (mcg) of folic acid every day if you become pregnant.  Ask for birth control (contraception) if you want to prevent pregnancy. Osteoporosis and menopause Osteoporosis is a disease in which the bones lose minerals and strength with aging. This can result in bone fractures. If you are 65 years old or older, or if you are at risk for osteoporosis and fractures, ask your health care provider if you should:  Be screened for bone loss.  Take a calcium or vitamin D supplement to lower your risk of fractures.  Be given hormone replacement therapy (HRT) to treat symptoms of menopause. Follow these instructions at home: Lifestyle  Do not use any products that contain nicotine or tobacco, such as cigarettes, e-cigarettes, and chewing tobacco. If you need help quitting, ask your health care provider.  Do not use street drugs.  Do not share needles.  Ask your health care provider for help if you need support or information about quitting drugs. Alcohol use  Do not drink alcohol if: ? Your health care provider tells you not to drink. ? You are pregnant, may be pregnant, or are planning to become pregnant.  If you drink alcohol: ? Limit how much you use to 0-1 drink a day. ? Limit intake if you are breastfeeding.  Be aware of how much alcohol is in your drink. In the [place] one drink equals one 12 oz bottle of beer (355 mL), one 5 oz glass of wine (148 mL), or one 1 oz glass of hard liquor (44 mL). General instructions  Schedule regular  health, dental, and eye exams.  Stay current with your vaccines.  Tell your health care provider if: ? You often feel depressed. ? You have ever been abused or do not feel safe at home. Summary  Adopting a healthy lifestyle and getting preventive care are important in promoting health and wellness.  Follow your health care provider's instructions about healthy diet, exercising, and getting tested or screened for diseases.  Follow your health care provider's instructions on monitoring your cholesterol and blood pressure. This information is not intended to replace advice given to you by your health care provider. Make sure you discuss any questions you have with your health care provider. Document Revised: 09/10/2018 Document Reviewed: 09/10/2018 Elsevier Patient Education  2021 Elsevier Inc.  

## 2021-03-07 LAB — LIPID PANEL
Cholesterol: 146 mg/dL (ref 0–200)
HDL: 50.7 mg/dL (ref >39.00)
LDL Cholesterol: 77 mg/dL (ref 0–99)
NonHDL: 94.86
Total CHOL/HDL Ratio: 3
Triglycerides: 90 mg/dL (ref 0.0–149.0)
VLDL: 18 mg/dL (ref 0.0–40.0)

## 2021-03-07 LAB — COMPREHENSIVE METABOLIC PANEL
ALT: 10 U/L (ref 0–35)
AST: 12 U/L (ref 0–37)
Albumin: 4.9 g/dL (ref 3.5–5.2)
Alkaline Phosphatase: 56 U/L (ref 39–117)
BUN: 18 mg/dL (ref 6–23)
CO2: 26 mEq/L (ref 19–32)
Calcium: 9.7 mg/dL (ref 8.4–10.5)
Chloride: 103 mEq/L (ref 96–112)
Creatinine, Ser: 0.66 mg/dL (ref 0.40–1.20)
GFR: 112.34 mL/min (ref >60.00)
Glucose, Bld: 69 mg/dL — ABNORMAL LOW (ref 70–99)
Potassium: 3.6 mEq/L (ref 3.5–5.1)
Sodium: 139 mEq/L (ref 135–145)
Total Bilirubin: 0.6 mg/dL (ref 0.2–1.2)
Total Protein: 7.6 g/dL (ref 6.0–8.3)

## 2021-03-07 LAB — MAGNESIUM: Magnesium: 2.4 mg/dL (ref 1.5–2.5)

## 2021-03-07 LAB — HEPATITIS C ANTIBODY
Hepatitis C Ab: NONREACTIVE
SIGNAL TO CUT-OFF: 0 (ref <1.00)

## 2021-04-21 ENCOUNTER — Other Ambulatory Visit: Payer: Self-pay

## 2021-04-21 ENCOUNTER — Encounter: Payer: Self-pay | Admitting: Family Medicine

## 2021-04-21 ENCOUNTER — Ambulatory Visit (INDEPENDENT_AMBULATORY_CARE_PROVIDER_SITE_OTHER): Payer: 59 | Admitting: Family Medicine

## 2021-04-21 VITALS — BP 97/64 | HR 60 | Temp 97.9°F | Ht 60.0 in | Wt 104.0 lb

## 2021-04-21 DIAGNOSIS — N764 Abscess of vulva: Secondary | ICD-10-CM

## 2021-04-21 MED ORDER — SULFAMETHOXAZOLE-TRIMETHOPRIM 800-160 MG PO TABS: 1.0000 | ORAL_TABLET | Freq: Two times a day (BID) | ORAL | 0 refills | Status: DC

## 2021-04-21 NOTE — Progress Notes (Signed)
This visit occurred during the SARS-CoV-2 public health emergency.  Safety protocols were in place, including screening questions prior to the visit, additional usage of staff PPE, and extensive cleaning of exam room while observing appropriate contact time as indicated for disinfecting solutions.    Cindy Thompson , Apr 26, 1986, 35 y.o., female MRN: [id number] Patient Care Team    Relationship Specialty Notifications Start End  Natalia Leatherwood, DO PCP - General Family Medicine  04/11/16   Cheral Marker, CNM Midwife Obstetrics and Gynecology  03/06/21     Chief Complaint  Patient presents with   Vaginal Itching    Pt c/o vaginal itching x 3 weeks;      Subjective: Pt presents for an OV with complaints of vaginal itching and swelling for 3 weeks intermittently. She reports she took zyrtec and it helped with swelling. However, within a few days it returned. She states it is painful to touch or if her pants are too tight. She denies any drainage from area or vaginal discharge. She denies fever.   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 She lives in [place]. Her mother helps her with children.    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 No pertinent surgical history. Family History  Problem Relation Age of Onset   Suicidality Father    Ovarian cancer Paternal Grandmother    Allergies as of  04/21/2021       Reactions   Crab [shellfish Allergy] Rash        Medication List        Accurate as of April 21, 2021 10:02 AM. If you have any questions, ask your nurse or doctor.          levocetirizine 5 MG tablet Commonly known as: XYZAL Take 1 tablet (5 mg total) by mouth every evening.   levonorgestrel 20 MCG/24HR IUD Commonly known as: MIRENA 1 each by Intrauterine route once.   Olopatadine HCl 0.2 % Soln Commonly known as: Pataday Place 1 drop into both eyes daily.   omeprazole 20 MG capsule Commonly known as: PRILOSEC Take 2 capsules (40 mg total) by mouth daily.   sulfamethoxazole-trimethoprim 800-160 MG tablet Commonly known as: BACTRIM DS Take 1 tablet by mouth 2 (two) times daily. Started by: Felix Pacini, DO   triamcinolone cream 0.1 % Commonly known as: KENALOG Apply 1 application topically 2 (two) times daily.        All past medical history, surgical history, allergies, family history, immunizations andmedications were updated in the EMR today and reviewed under the history and medication portions of their EMR.     ROS: Negative, with the exception of above mentioned in HPI   Objective:  BP 97/64   Pulse 60   Temp 97.9 F (36.6 C) (  Oral)   Ht 5' (1.524 m)   Wt 104 lb (47.2 kg)   SpO2 100%   BMI 20.31 kg/m  Body mass index is 20.31 kg/m. Gen: Afebrile. No acute distress. Nontoxic in appearance, well developed, well nourished.  HENT: AT. [place] .Eyes:Pupils Equal Round Reactive to light, Extraocular movements intact,  Conjunctiva without redness, discharge or icterus. GYN:  External genitalia shaved, no ulcerations or rash, small area of hyperpigmentation left labia majora with TTP and ?fluctuance/texture change in this area. No erythema or drainage. Urethral meatus normal, no lesions.   Neuro: Normal gait. PERLA. EOMi. Alert. Oriented x3   No results found. No results found. No results found for this or any previous visit (from the  past 24 hour(s)).  Assessment/Plan: Cindy Thompson is a 35 y.o. female present for OV for  Left genital labial abscess Continue zyrtec- seems to help with discomfort.  - bactrim BID x 5 days- abscess is small and hopefully with resolved with abx.  If sx do not continue to improve or worsen- would need to get her to GYN for eval.   Reviewed expectations re: course of current medical issues. Discussed self-management of symptoms. Outlined signs and symptoms indicating need for more acute intervention. Patient verbalized understanding and all questions were answered. Patient received an After-Visit Summary.    No orders of the defined types were placed in this encounter.  Meds ordered this encounter  Medications   sulfamethoxazole-trimethoprim (BACTRIM DS) 800-160 MG tablet    Sig: Take 1 tablet by mouth 2 (two) times daily.    Dispense:  10 tablet    Refill:  0   Referral Orders  No referral(s) requested today     Note is dictated utilizing voice recognition software. Although note has been proof read prior to signing, occasional typographical errors still can be missed. If any questions arise, please do not hesitate to call for verification.   electronically signed by:  Felix Pacini, DO   [facility] [place]

## 2021-04-21 NOTE — Patient Instructions (Signed)
Take one tab of antibiotic every 12 hours for 5 days.   Skin Abscess  A skin abscess is an infected area of your skin that contains pus and other material. An abscess can happen in any part of your body. Some abscesses break open (rupture) on their own. Most continue to get worse unless they are treated. The infection can spread deeper into the body and into your blood, which can makeyou feel sick. A skin abscess is caused by germs that enter the skin through a cut or scrape. It can also be caused by blocked oil and sweat glands or infected hairfollicles. This condition is usually treated by: Draining the pus. Taking antibiotic medicines. Placing a warm, wet washcloth over the abscess. Follow these instructions at home: Medicines  Take over-the-counter and prescription medicines only as told by your doctor. If you were prescribed an antibiotic medicine, take it as told by your doctor. Do not stop taking the antibiotic even if you start to feel better.  Abscess care  If you have an abscess that has not drained, place a warm, clean, wet washcloth over the abscess several times a day. Do this as told by your doctor. Follow instructions from your doctor about how to take care of your abscess. Make sure you: Cover the abscess with a bandage (dressing). Change your bandage or gauze as told by your doctor. Wash your hands with soap and water before you change the bandage or gauze. If you cannot use soap and water, use hand sanitizer. Check your abscess every day for signs that the infection is getting worse. Check for: More redness, swelling, or pain. More fluid or blood. Warmth. More pus or a bad smell.  General instructions To avoid spreading the infection: Do not share personal care items, towels, or hot tubs with others. Avoid making skin-to-skin contact with other people. Keep all follow-up visits as told by your doctor. This is important. Contact a doctor if: You have more redness,  swelling, or pain around your abscess. You have more fluid or blood coming from your abscess. Your abscess feels warm when you touch it. You have more pus or a bad smell coming from your abscess. You have a fever. Your muscles ache. You have chills. You feel sick. Get help right away if: You have very bad (severe) pain. You see red streaks on your skin spreading away from the abscess. Summary A skin abscess is an infected area of your skin that contains pus and other material. The abscess is caused by germs that enter the skin through a cut or scrape. It can also be caused by blocked oil and sweat glands or infected hair follicles. Follow your doctor's instructions on caring for your abscess, taking medicines, preventing infections, and keeping follow-up visits. This information is not intended to replace advice given to you by your health care provider. Make sure you discuss any questions you have with your healthcare provider. Document Revised: 04/23/2019 Document Reviewed: 10/31/2017 Elsevier Patient Education  2022 [employer].

## 2021-04-28 ENCOUNTER — Telehealth: Payer: Self-pay | Admitting: Family Medicine

## 2021-04-28 MED ORDER — SULFAMETHOXAZOLE-TRIMETHOPRIM 800-160 MG PO TABS: 1.0000 | ORAL_TABLET | Freq: Two times a day (BID) | ORAL | 0 refills | Status: DC

## 2021-04-28 NOTE — Telephone Encounter (Signed)
Spoke with pt regarding abx

## 2021-04-28 NOTE — Telephone Encounter (Signed)
Patient states she completed 5 day course of Bactrim DS and the abscess is smaller but not gone. She wonders if she needs a refill or to try a different medication. Please call patient to advise.

## 2021-04-28 NOTE — Telephone Encounter (Signed)
Called in another 5 days.

## 2021-04-28 NOTE — Telephone Encounter (Signed)
Please advise 

## 2021-05-09 ENCOUNTER — Encounter: Payer: 59 | Admitting: Adult Health

## 2021-05-18 IMAGING — CR PORTABLE CHEST - 1 VIEW
1 series · 2 of 2 positions shown · non-contrast
Comparison: Radiographs April 22, 2014.

CLINICAL DATA: Shortness of breath, cough.

EXAM:
PORTABLE CHEST 1 VIEW

[Series 1: portable · 0.17mm/px · 2 of 2 slices shown]
[im 1/2]
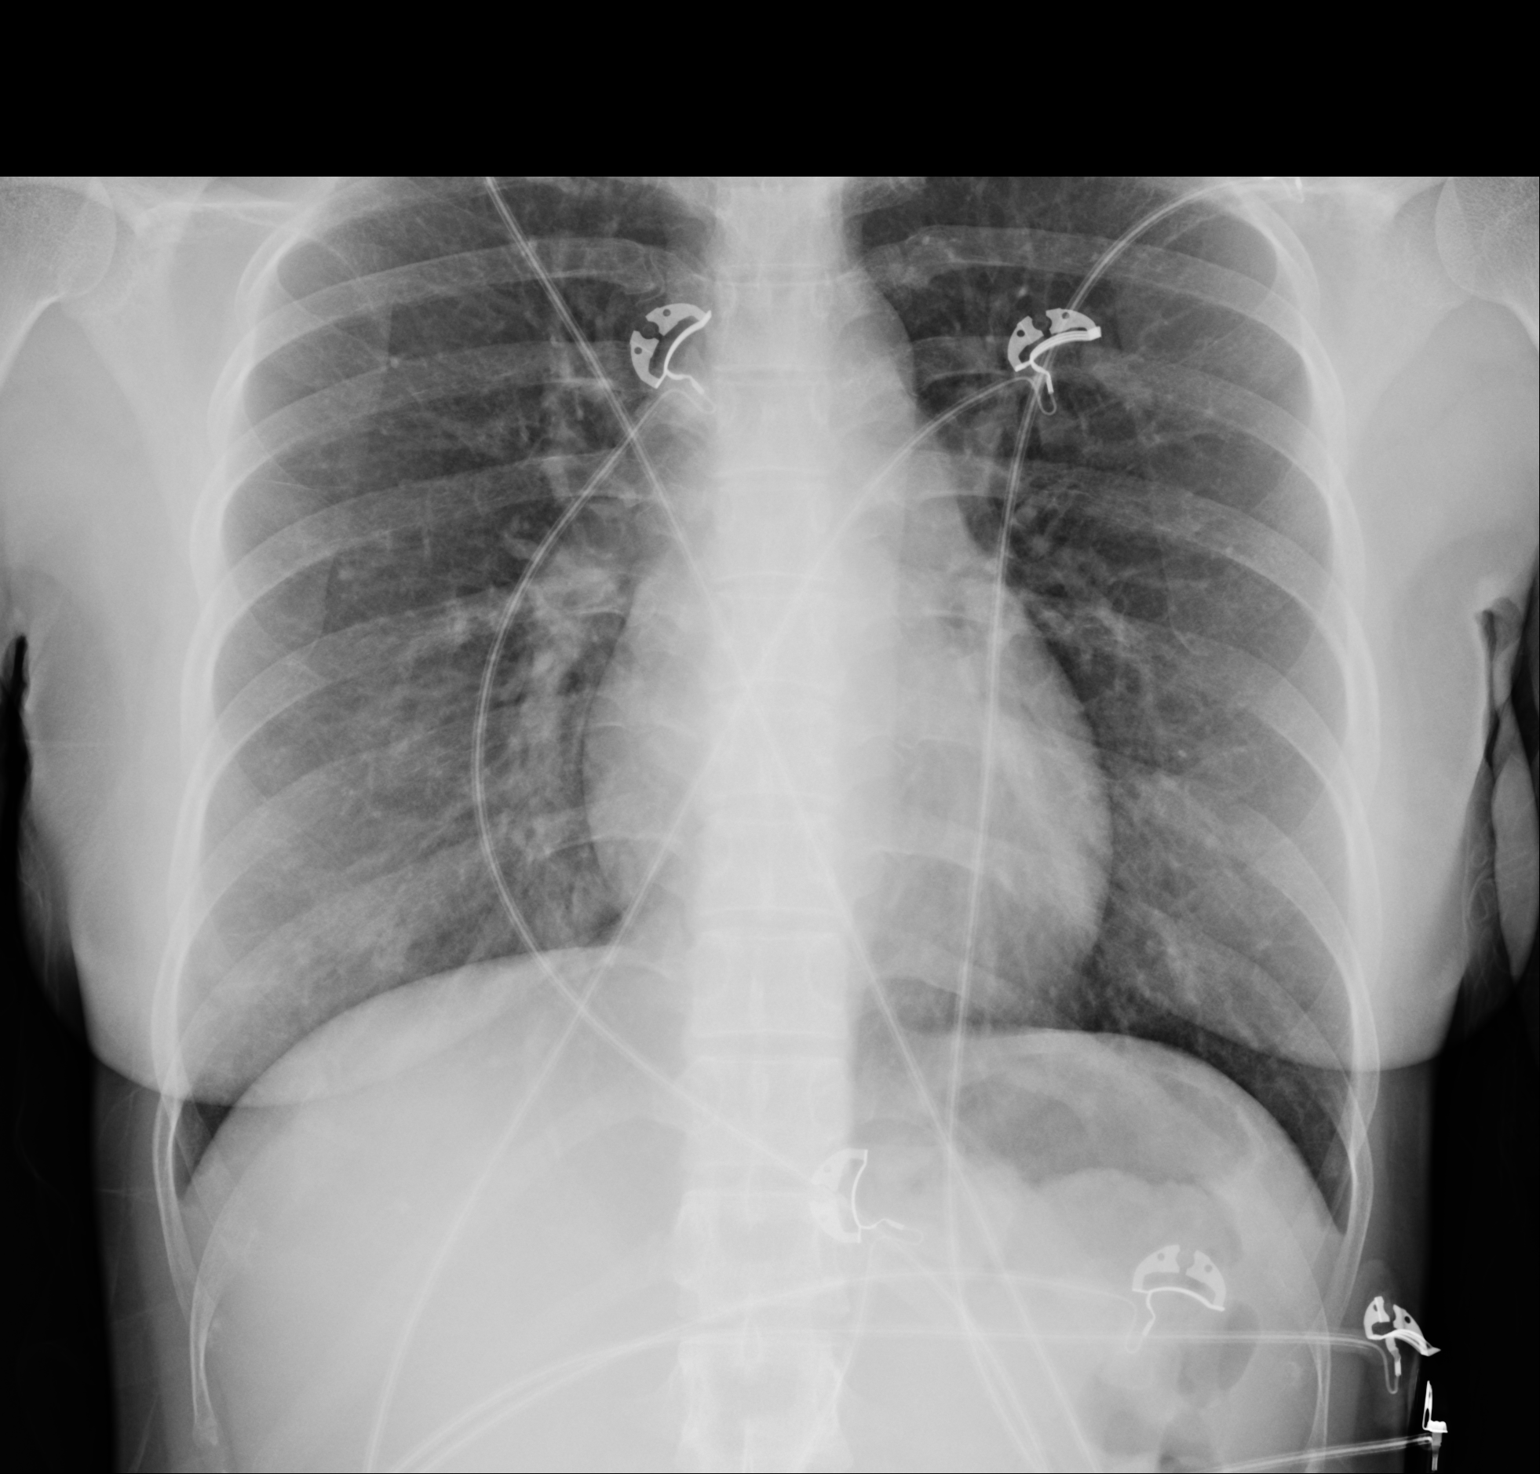
[im 2/2]
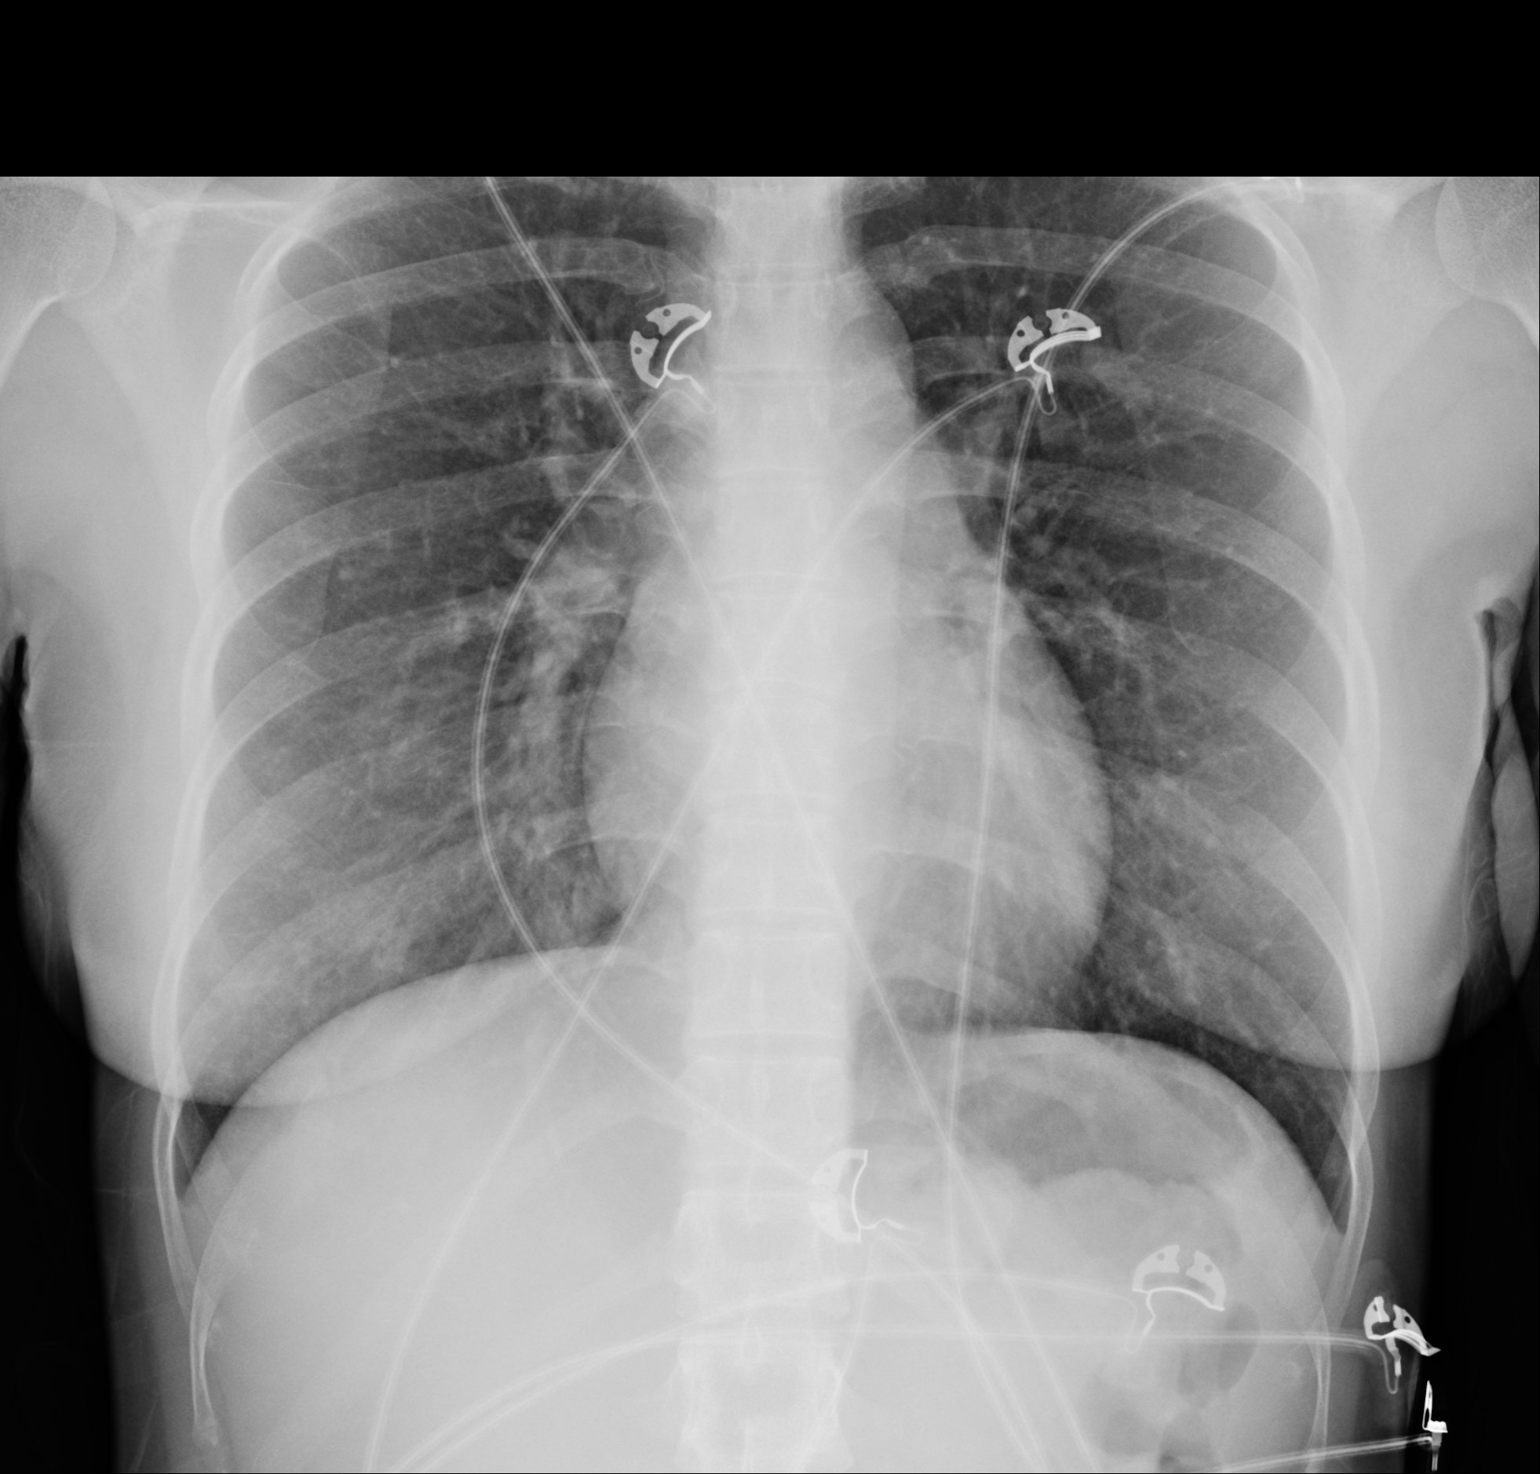

[2 of 2 positions shown; findings below may reference images not displayed]

FINDINGS: The heart size and mediastinal contours are within normal limits.
Both lungs are clear. No pneumothorax or pleural effusion is noted.
The visualized skeletal structures are unremarkable.
IMPRESSION: No active disease.

## 2021-06-12 ENCOUNTER — Encounter: Payer: Self-pay | Admitting: Adult Health

## 2021-06-12 ENCOUNTER — Other Ambulatory Visit (HOSPITAL_COMMUNITY)
Admission: RE | Admit: 2021-06-12 | Discharge: 2021-06-12 | Disposition: A | Discharge status: Home / Self Care | Payer: 59 | Source: Ambulatory Visit | Attending: Adult Health | Admitting: Adult Health

## 2021-06-12 ENCOUNTER — Ambulatory Visit (INDEPENDENT_AMBULATORY_CARE_PROVIDER_SITE_OTHER): Payer: 59 | Admitting: Adult Health

## 2021-06-12 ENCOUNTER — Other Ambulatory Visit: Payer: Self-pay

## 2021-06-12 VITALS — BP 94/59 | HR 61 | Ht 60.0 in | Wt 107.0 lb

## 2021-06-12 DIAGNOSIS — Z30431 Encounter for routine checking of intrauterine contraceptive device: Secondary | ICD-10-CM

## 2021-06-12 DIAGNOSIS — Z975 Presence of (intrauterine) contraceptive device: Secondary | ICD-10-CM

## 2021-06-12 DIAGNOSIS — Z01419 Encounter for gynecological examination (general) (routine) without abnormal findings: Secondary | ICD-10-CM | POA: Insufficient documentation

## 2021-06-12 NOTE — Progress Notes (Signed)
Patient ID: Cindy Thompson, female   DOB: Feb 16, 1986, 35 y.o.   MRN: [id number] History of Present Illness: Cindy Thompson is a 35 year old Asian female, married. [id number] in for pelvic and pap, she had physical with PCP. PCP is Dr Claiborne Billings.   Current Medications, Allergies, Past Medical History, Past Surgical History, Family History and Social History were reviewed in [employer] record.     Review of Systems: Patient denies any headaches, hearing loss, fatigue, blurred vision, shortness of breath, chest pain, abdominal pain, problems with bowel movements, urination, or intercourse. No joint pain or mood swings.  Has period at times with IUD.    Physical Exam:BP (!) 94/59 (BP Location: Left Arm, Patient Position: Sitting, Cuff Size: Normal)   Pulse 61   Ht 5' (1.524 m)   Wt 107 lb (48.5 kg)   LMP 04/25/2021   BMI 20.90 kg/m   General:  Well developed, well nourished, no acute distress Skin:  Warm and dry Neck:  Midline trachea, normal thyroid, good ROM, no lymphadenopathy Lungs; Clear to auscultation bilaterally Breast:  No dominant palpable mass, retraction, or nipple discharge.left nipple inverted,chronically and itches at times Cardiovascular: Regular rate and rhythm Abdomen:  Soft, non tender, no hepatosplenomegaly Pelvic:  External genitalia is normal in appearance, no lesions.  The vagina is normal in appearance. Urethra has no lesions or masses. The cervix is bulbous. No IUD strings seen,pap with HR HPV genotyping performed. Uterus is felt to be normal size, shape, and contour.  No adnexal masses or tenderness noted.Bladder is non tender, no masses felt. POC US shows IUD in place Extremities/musculoskeletal:  No swelling or varicosities noted, no clubbing or cyanosis Psych:  No mood changes, alert and cooperative,seems happy AA is 0  Fall risk is low Depression screen [facility] 2/9 06/12/2021 03/06/2021 02/04/2018  Decreased Interest 0 0 0  Down, Depressed, Hopeless 0 0 0  PHQ - 2  Score 0 0 0  Altered sleeping 0 - -  Tired, decreased energy 0 - -  Change in appetite 0 - -  Feeling bad or failure about yourself  0 - -  Trouble concentrating 0 - -  Moving slowly or fidgety/restless 0 - -  Suicidal thoughts 0 - -  PHQ-9 Score 0 - -    GAD 7 : Generalized Anxiety Score 06/12/2021  Nervous, Anxious, on Edge 0  Control/stop worrying 0  Worry too much - different things 0  Trouble relaxing 0  Restless 0  Easily annoyed or irritable 0  Afraid - awful might happen 0  Total GAD 7 Score 0      Upstream - 06/12/21 1349       Pregnancy Intention Screening   Does the patient want to become pregnant in the next year? No    Does the patient's partner want to become pregnant in the next year? No    Would the patient like to discuss contraceptive options today? No      Contraception Wrap Up   Current Method IUD or IUS    End Method IUD or IUS    Contraception Counseling Provided No           .Examination chaperoned by Malachy Mood LPN   Impression and Plan: 1. Encounter for gynecological examination with Papanicolaou smear of cervix Pap sent Physical with PCP  Pap in 3 years if normal Labs with PCP   2. IUD (intrauterine device) in place Was placed 01/14/14. Return in 7 months for IUD removal  and reinsertion of mirena

## 2021-06-16 LAB — CYTOLOGY - PAP
Comment: NEGATIVE
Diagnosis: NEGATIVE
High risk HPV: NEGATIVE

## 2021-08-28 ENCOUNTER — Other Ambulatory Visit: Payer: Self-pay | Admitting: Family Medicine

## 2021-11-06 ENCOUNTER — Encounter: Payer: Self-pay | Admitting: Family Medicine

## 2021-11-06 ENCOUNTER — Other Ambulatory Visit: Payer: Self-pay

## 2021-11-06 ENCOUNTER — Telehealth: Payer: Self-pay | Admitting: Family Medicine

## 2021-11-06 ENCOUNTER — Ambulatory Visit (INDEPENDENT_AMBULATORY_CARE_PROVIDER_SITE_OTHER): Payer: Self-pay | Admitting: Family Medicine

## 2021-11-06 VITALS — BP 106/72 | HR 75 | Temp 98.2°F | Resp 12 | Ht 59.75 in | Wt 112.6 lb

## 2021-11-06 DIAGNOSIS — Z79899 Other long term (current) drug therapy: Secondary | ICD-10-CM

## 2021-11-06 DIAGNOSIS — R202 Paresthesia of skin: Secondary | ICD-10-CM

## 2021-11-06 DIAGNOSIS — F419 Anxiety disorder, unspecified: Secondary | ICD-10-CM | POA: Insufficient documentation

## 2021-11-06 DIAGNOSIS — G514 Facial myokymia: Secondary | ICD-10-CM

## 2021-11-06 DIAGNOSIS — Z5181 Encounter for therapeutic drug level monitoring: Secondary | ICD-10-CM

## 2021-11-06 HISTORY — DX: Facial myokymia: G51.4

## 2021-11-06 HISTORY — DX: Paresthesia of skin: R20.2

## 2021-11-06 LAB — BASIC METABOLIC PANEL
BUN: 16 mg/dL (ref 6–23)
CO2: 30 mEq/L (ref 19–32)
Calcium: 9.5 mg/dL (ref 8.4–10.5)
Chloride: 103 mEq/L (ref 96–112)
Creatinine, Ser: 0.58 mg/dL (ref 0.40–1.20)
GFR: 115.35 mL/min (ref >60.00)
Glucose, Bld: 93 mg/dL (ref 70–99)
Potassium: 3.9 mEq/L (ref 3.5–5.1)
Sodium: 141 mEq/L (ref 135–145)

## 2021-11-06 LAB — TSH: TSH: 1.87 u[IU]/mL (ref 0.35–5.50)

## 2021-11-06 LAB — MAGNESIUM: Magnesium: 2.4 mg/dL (ref 1.5–2.5)

## 2021-11-06 LAB — VITAMIN B12: Vitamin B-12: 715 pg/mL (ref 211–911)

## 2021-11-06 MED ORDER — ESCITALOPRAM OXALATE 10 MG PO TABS: 10.0000 mg | ORAL_TABLET | Freq: Every day | ORAL | 1 refills | Status: DC

## 2021-11-06 MED ORDER — PREDNISONE 20 MG PO TABS: 40.0000 mg | ORAL_TABLET | Freq: Every day | ORAL | 0 refills | Status: DC
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
## 2021-11-06 NOTE — Patient Instructions (Signed)
Start lexapro daily to help with anxiety - this needs to be taken daily.  Start prednisone 2 tabs daily for 5 days- this helps decrease inflammation.  We will call you with lab results and further guidance.    Benign Essential Blepharospasm, Adult Benign essential blepharospasm (BEB) is a nervous system condition that makes a person close his or her eyes without meaning to. Over time, episodes of this condition may become more frequent and forceful. This can make it hard to keep the eyes open and do things like watch television, drive, or read. If this condition is not treated, the eyes may close forcefully for long periods of time. What are the causes? The exact cause of this condition is not known. It may be passed down through families with an abnormal gene. Symptoms may be triggered by: The wind. Sunlight or bright lights. Noise. Walking outside. Air pollution. Other triggers include: Eye strain from reading, watching TV, using electronic devices, or driving. Fatigue. Stress. What increases the risk? You are more likely to develop this condition if: You are age 55 or older. You have a family history of BEB. You are female. There are problems with the part of your brain that controls movements. You have a movement disorder called dystonia. You have a history of eye diseases or trauma to one eye or both eyes. You take certain medicines, such as those used to treat Parkinson's disease. What are the signs or symptoms? The first symptom of this condition is frequent eye blinking or twitching that you cannot control. It may happen during the day and disappear at night. Other early symptoms include: Eye dryness and irritation. Eye irritation or pain from bright lights (photophobia). You may get temporary relief from your symptoms when you sing, yawn, chew, or laugh. Later symptoms of this condition include: Winking and squinting for longer than usual. Muscle spasms in your tongue and  jaw. Inability to keep your eyes open for long periods of time. Over time, symptoms may get stronger and last longer. How is this diagnosed? This condition is diagnosed based on your symptoms, your medical history, and a physical exam. How is this treated? There is no cure for this condition, but treatment can help your symptoms. Treatment options include: Applying moisturizing eye drops (artificial tears) to the eye. These drops help ease eye irritation and dryness. Getting an injection of botulinum toxin into the muscles that control eyelid movement. This treatment usually needs to be repeated every 3-4 months. Taking medicines such as muscle relaxants and anti-anxiety medicines. Having surgery to remove part of the eyelid muscles (myectomy). This may be done if injections of botulinum toxin do not work or they stop working. Follow these instructions at home: Lifestyle Wear tinted sunglasses that block UV (ultraviolet) light. Wear eye protection outdoors on windy days. Get enough sleep. Try to manage or avoid stressful situations. Do not watch TV or have screen time for long periods of time. Avoid things that trigger your condition. General instructions Learn as much as you can about your condition. Work closely with your team of health care providers. Use over-the-counter and prescription medicines, including eye drops, only as told by your health care provider. Keep your eyelids clean. Wash them daily with mild soap and water. This will help prevent irritation and infection. Do not drive if you are having an episode that affects how well you can see. Keep all follow-up visits. Contact a health care provider if: Your symptoms are not controlled with treatment.  Your eyes are red, teary, or dry. Your eyes droop. You feel anxious or depressed. Get help right away if: You cannot open your eyes. Summary Benign essential blepharospasm (BEB) is a nervous system condition that makes a  person close his or her eyes without meaning to. The exact cause of this condition is not known. It may be passed down through families with an abnormal gene. There is no cure for this condition, but treatment can help your symptoms. This information is not intended to replace advice given to you by your health care provider. Make sure you discuss any questions you have with your health care provider. Document Revised: 03/17/2021 Document Reviewed: 03/17/2021 Elsevier Patient Education  2022 [employer].

## 2021-11-06 NOTE — Telephone Encounter (Signed)
Please inform patient Her electrolytes, B12 and thyroid are all normal levels and not the cause of her current symptoms. I would encourage her to start the medications we discussed today and follow-up in 8 weeks.  Hopefully once the anxiety medicine is able to start to take effect, she will notice the twitching will decrease.

## 2021-11-07 NOTE — Telephone Encounter (Signed)
Spoke with pt with interpreter [phone] regarding labs and instructions.

## 2022-01-01 ENCOUNTER — Ambulatory Visit (INDEPENDENT_AMBULATORY_CARE_PROVIDER_SITE_OTHER): Payer: 59 | Admitting: Family Medicine

## 2022-01-01 ENCOUNTER — Encounter: Payer: Self-pay | Admitting: Family Medicine

## 2022-01-01 VITALS — BP 108/68 | HR 69 | Temp 97.9°F | Ht 59.75 in | Wt 115.0 lb

## 2022-01-01 DIAGNOSIS — G479 Sleep disorder, unspecified: Secondary | ICD-10-CM

## 2022-01-01 DIAGNOSIS — G514 Facial myokymia: Secondary | ICD-10-CM | POA: Diagnosis not present

## 2022-01-01 DIAGNOSIS — R202 Paresthesia of skin: Secondary | ICD-10-CM | POA: Diagnosis not present

## 2022-01-01 DIAGNOSIS — R69 Illness, unspecified: Secondary | ICD-10-CM | POA: Diagnosis not present

## 2022-01-01 DIAGNOSIS — F419 Anxiety disorder, unspecified: Secondary | ICD-10-CM | POA: Diagnosis not present

## 2022-01-01 MED ORDER — ESCITALOPRAM OXALATE 10 MG PO TABS: 10.0000 mg | ORAL_TABLET | Freq: Every day | ORAL | 1 refills | Status: DC

## 2022-01-01 NOTE — Patient Instructions (Signed)
? ?  I am so glad to hear the medication is working  well for you.  ?I have refilled the medicine for you and follow up in 5.5 months.  ?
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
## 2022-03-13 ENCOUNTER — Telehealth: Payer: Self-pay | Admitting: Family Medicine

## 2022-03-23 ENCOUNTER — Other Ambulatory Visit: Payer: Self-pay | Admitting: Family Medicine

## 2022-03-28 ENCOUNTER — Other Ambulatory Visit: Payer: Self-pay | Admitting: Family Medicine

## 2022-03-28 MED ORDER — TRIAMCINOLONE ACETONIDE 0.1 % EX CREA: 1.0000 | TOPICAL_CREAM | Freq: Two times a day (BID) | CUTANEOUS | 5 refills | Status: DC

## 2022-03-28 NOTE — Telephone Encounter (Signed)
Refilled for her 

## 2022-03-28 NOTE — Telephone Encounter (Signed)
Called pt for appt for refill on triamcinolone cream (KENALOG) 0.1 % . Pt declined appt due to busy sched would like to know if PCP can refill med since it is for same reason as last time.

## 2022-03-28 NOTE — Telephone Encounter (Signed)
Pt friend called stating she needs the cream for itching and ran out 1-2 days ago. I asked for name of cream and she couldn't verify. Refill states denied as "not appropriate".

## 2022-03-28 NOTE — Progress Notes (Signed)
refilled 

## 2022-03-28 NOTE — Telephone Encounter (Signed)
Pt informed

## 2022-04-26 ENCOUNTER — Other Ambulatory Visit: Payer: Self-pay | Admitting: Family Medicine

## 2022-04-27 ENCOUNTER — Other Ambulatory Visit: Payer: Self-pay | Admitting: Family Medicine

## 2022-05-02 ENCOUNTER — Ambulatory Visit (INDEPENDENT_AMBULATORY_CARE_PROVIDER_SITE_OTHER): Payer: 59 | Admitting: Family Medicine

## 2022-05-02 ENCOUNTER — Encounter: Payer: Self-pay | Admitting: Family Medicine

## 2022-05-02 VITALS — BP 94/61 | HR 66 | Temp 97.5°F | Ht 59.75 in | Wt 116.0 lb

## 2022-05-02 DIAGNOSIS — R21 Rash and other nonspecific skin eruption: Secondary | ICD-10-CM | POA: Diagnosis not present

## 2022-05-02 DIAGNOSIS — S90562A Insect bite (nonvenomous), left ankle, initial encounter: Secondary | ICD-10-CM | POA: Diagnosis not present

## 2022-05-02 DIAGNOSIS — S90862A Insect bite (nonvenomous), left foot, initial encounter: Secondary | ICD-10-CM | POA: Diagnosis not present

## 2022-05-02 DIAGNOSIS — S90861A Insect bite (nonvenomous), right foot, initial encounter: Secondary | ICD-10-CM | POA: Diagnosis not present

## 2022-05-02 DIAGNOSIS — W57XXXA Bitten or stung by nonvenomous insect and other nonvenomous arthropods, initial encounter: Secondary | ICD-10-CM | POA: Diagnosis not present

## 2022-05-02 DIAGNOSIS — S50862A Insect bite (nonvenomous) of left forearm, initial encounter: Secondary | ICD-10-CM | POA: Diagnosis not present

## 2022-05-02 DIAGNOSIS — S90561A Insect bite (nonvenomous), right ankle, initial encounter: Secondary | ICD-10-CM | POA: Diagnosis not present

## 2022-05-02 MED ORDER — FEXOFENADINE HCL 180 MG PO TABS: ORAL_TABLET | ORAL | 11 refills | Status: DC

## 2022-05-02 MED ORDER — LEVOCETIRIZINE DIHYDROCHLORIDE 5 MG PO TABS: 5.0000 mg | ORAL_TABLET | Freq: Every evening | ORAL | 3 refills | Status: DC

## 2022-05-02 MED ORDER — METHYLPREDNISOLONE ACETATE 80 MG/ML IJ SUSP
80.0000 mg | Freq: Once | INTRAMUSCULAR | Status: AC
  Administered 2022-05-02: 80 mg via INTRAMUSCULAR

## 2022-05-02 NOTE — Patient Instructions (Signed)
Insect Bite, Adult An insect bite can make your skin red, itchy, and swollen. Some insects can spread disease to people with a bite. However, most insect bites do not lead to disease, and most are not serious. What are the causes? Insects may bite for many reasons, including: Hunger. To defend themselves. Insects that bite include: Spiders. Mosquitoes. Ticks. Fleas. Ants. Flies. Kissing bugs. Chiggers. What are the signs or symptoms? Symptoms of this condition include: Itching or pain in the bite area. Redness and swelling in the bite area. An open wound (skin ulcer). Symptoms often last for 2-4 days. In rare cases, a person may have a very bad allergic reaction (anaphylactic reaction) to a bite. Symptoms of an anaphylactic reaction may include: Feeling warm in the face (flushed). Your face may turn red. Itchy, red, swollen areas of skin (hives). Swelling of the: Eyes. Lips. Face. Mouth. Tongue. Throat. Trouble with any of these: Breathing. Talking. Swallowing. Loud breathing (wheezing). Feeling dizzy or light-headed. Passing out (fainting). Pain or cramps in your belly. Throwing up (vomiting). Watery poop (diarrhea). How is this treated? Treatment is usually not needed. Symptoms often go away on their own. When treatment is needed, it may involve: Putting a cream or lotion on the bite area. This helps with itching. Taking an antibiotic medicine. This treatment is needed if the bite area gets infected. Getting a tetanus shot, if you are not up to date on this vaccine. Putting ice on the affected area. Using medicines called antihistamines. This treatment may be needed if you have itching or an allergic reaction to the insect bite. Giving yourself a shot of medicine (epinephrine) using an auto-injector "pen" if you have an anaphylactic reaction to a bite. Your doctor will teach you how to use this pen. Follow these instructions at home: Bite area care  Do not  scratch the bite area. Keep the bite area clean and dry. Wash the bite area every day with soap and water as told by your doctor. Check the bite area every day for signs of infection. Check for: Redness, swelling, or pain. Fluid or blood. Warmth. Pus or a bad smell. Managing pain, itching, and swelling  You may put any of these on the bite area as told by your doctor: A paste made of baking soda and water. Cortisone cream. Calamine lotion. If told, put ice on the bite area. Put ice in a plastic bag. Place a towel between your skin and the bag. Leave the ice on for 20 minutes, 2-3 times a day. General instructions Apply or take over-the-counter and prescription medicines only as told by your doctor. If you were prescribed an antibiotic medicine, take or apply it as told by your doctor. Do not stop using the antibiotic even if your condition improves. Keep all follow-up visits as told by your doctor. This is important. How is this prevented? To help you have a lower risk of insect bites: When you are outside, wear clothing that covers your arms and legs. Use insect repellent. The best insect repellents contain one of these: DEET. Picaridin. Oil of lemon eucalyptus (OLE). IR3535. Consider spraying your clothing with a pesticide called permethrin. Permethrin helps prevent insect bites. It works for several weeks and for up to 5-6 clothing washes. Do not apply permethrin directly to the skin. If your home windows do not have screens, think about putting some in. If you will be sleeping in an area where there are mosquitoes, consider covering your sleeping area with a mosquito   net. Contact a doctor if: You have redness, swelling, or pain in the bite area. You have fluid or blood coming from the bite area. The bite area feels warm to the touch. You have pus or a bad smell coming from the bite area. You have a fever. Get help right away if: You have joint pain. You have a rash. You  feel more tired or sleepy than you normally do. You have neck pain. You have a headache. You feel weaker than you normally do. You have signs of an anaphylactic reaction. Signs may include: Feeling warm in the face. Itchy, red, swollen areas of skin. Swelling of your: Eyes. Lips. Face. Mouth. Tongue. Throat. Trouble with any of these: Breathing. Talking. Swallowing. Loud breathing. Feeling dizzy or light-headed. Passing out. Pain or cramps in your belly. Throwing up. Watery poop. These symptoms may be an emergency. Do not wait to see if the symptoms will go away. Do this right away: Use your auto-injector pen as you have been told. Get medical help. Call your local emergency services (911 in the [place] Do not drive yourself to the hospital. Summary An insect bite can make your skin red, itchy, and swollen. Treatment is usually not needed. Symptoms often go away on their own. Do not scratch the bite area. Keep it clean and dry. Ice can help with pain and itching from the bite. This information is not intended to replace advice given to you by your health care provider. Make sure you discuss any questions you have with your health care provider. Document Revised: 06/19/2021 Document Reviewed: 06/19/2021 Elsevier Patient Education  2023 Else[employer] Inc[employer]  

## 2022-05-02 NOTE — Progress Notes (Signed)
Cindy Thompson , 08-21-1986, 36 y.o., female MRN: [id number] Patient Care Team    Relationship Specialty Notifications Start End  Ma Hillock, DO PCP - General Family Medicine  04/11/16   Roma Schanz, CNM Midwife Obstetrics and Gynecology  03/06/21     Chief Complaint  Patient presents with   Pruritis    Pt c/o systemic itching  x 1 mo; recently moved in new house prior owner had cats and dogs     Subjective: Pt presents for an OV with complaints of pruritus with rash.  She states that she moved into a new house in the prior owners had cats and dogs.  There was carpet in the home at that time.  She states she has pulled up all the carpet and has thoroughly cleaning the apartment but she continues to have an itchy rash.  She started the Zyrtec before bed and states that it does help, but she is still itching throughout the day.  She also notices feeling more itchy when she is outside.  The new home does have trees surrounding the yard.     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 She lives in [place]. Her mother helps her with children.    Past Medical History:  Diagnosis Date   Allergic urticaria 02/04/2018   Allergy    Angio-edema    COVID-19 virus detected 04/10/2019   W/ sob and hemoptysis    Eczema 03/06/2021   GERD (gastroesophageal reflux disease)     Lateral epicondylitis of right elbow 11/18/2017   Thyroid disease    thyroid nodules   Urticaria    History reviewed. No pertinent surgical history. Family History  Problem Relation Age of Onset   Suicidality Father    Stroke Sister    Ovarian cancer Paternal Grandmother    Allergies as of 05/02/2022       Reactions   Crab [shellfish Allergy] Rash        Medication List        Accurate as of May 02, 2022  4:07 PM. If you have any questions, ask your nurse or doctor.          escitalopram 10 MG tablet Commonly known as: Lexapro Take 1 tablet (10 mg total) by mouth daily.   fexofenadine 180 MG tablet Commonly known as: ALLEGRA 1 tab in the morning Started by: Howard Pouch, DO   levocetirizine 5 MG tablet Commonly known as: XYZAL Take 1 tablet (5 mg total) by mouth every evening. What changed:  how much to take how to take this when to take this reasons to take this Changed by: Felix Pacini, DO   levonorgestrel 20 MCG/24HR IUD Commonly known as: MIRENA 1 each by Intrauterine route once.   omeprazole 20 MG capsule Commonly known as: PRILOSEC TAKE 2 CAPSULES BY MOUTH ONCE A DAY.   triamcinolone cream 0.1 % Commonly known as: KENALOG Apply 1 Application topically 2 (two) times daily.        All past medical history, surgical history, allergies, family history, immunizations andmedications were updated in the EMR today and reviewed under the history and medication portions of their EMR.     ROS Negative, with the exception of above mentioned in HPI   Objective:  BP 94/61   Pulse 66   Temp (!) 97.5 F (36.4 C) (Oral)   Ht 4' 11.75" (1.518 m)   Wt 116 lb (52.6 kg)   SpO2 98%   BMI 22.84 kg/m  Body mass index is 22.84 kg/m. Physical Exam Vitals and nursing note reviewed.  Constitutional:      General: She is not in acute distress.    Appearance: Normal appearance. She is normal weight. She is not ill-appearing or toxic-appearing.  Eyes:      Extraocular Movements: Extraocular movements intact.     Conjunctiva/sclera: Conjunctivae normal.     Pupils: Pupils are equal, round, and reactive to light.  Skin:    Findings: Rash (Bilateral ankles and feet.  Left forearm small red excoriated patches) present.  Neurological:     Mental Status: She is alert and oriented to person, place, and time. Mental status is at baseline.  Psychiatric:        Mood and Affect: Mood normal.        Behavior: Behavior normal.        Thought Content: Thought content normal.        Judgment: Judgment normal.      No results found. No results found. No results found for this or any previous visit (from the past 24 hour(s)).  Assessment/Plan: Cindy Thompson is a 36 y.o. female present for OV for  Rash/insect bite Rashes consistent with possible etiology of insect bites including fleas and/or mosquitoes.  Initially it was occurring mostly in the home until she ripped up the carpet which suggest possible fleas since the morning patient is mostly on her ankles and feet.   She also has a backyard surrounded by trees, which is new for her. We discussed preventative measures for outdoors with insect repellent sprays and insect repellent stickers etc. Start Allegra in the day Continue steroid cream as needed Continue Xyzal at night Offered steroid injection today to help with inflammatory reaction surrounding the multiple insect bites. - methylPREDNISolone acetate (DEPO-MEDROL) injection 80 mg   Reviewed expectations re: course of current medical issues. Discussed self-management of symptoms. Outlined signs and symptoms indicating need for more acute intervention. Patient verbalized understanding and all questions were answered. Patient received an After-Visit Summary.    No orders of the defined types were placed in this encounter.  Meds ordered this encounter  Medications   levocetirizine (XYZAL) 5 MG tablet    Sig: Take 1 tablet (5 mg total) by  mouth every evening.    Dispense:  90 tablet    Refill:  3   fexofenadine (ALLEGRA) 180 MG tablet    Sig: 1 tab in the morning    Dispense:  30 tablet    Refill:  11  methylPREDNISolone acetate (DEPO-MEDROL) injection 80 mg   Referral Orders  No referral(s) requested today     Note is dictated utilizing voice recognition software. Although note has been proof read prior to signing, occasional typographical errors still can be missed. If any questions arise, please do not hesitate to call for verification.   electronically signed by:  Felix Pacini, DO  [facility] [place]

## 2022-06-18 ENCOUNTER — Ambulatory Visit (INDEPENDENT_AMBULATORY_CARE_PROVIDER_SITE_OTHER): Payer: Self-pay | Admitting: Family Medicine

## 2022-06-18 DIAGNOSIS — K219 Gastro-esophageal reflux disease without esophagitis: Secondary | ICD-10-CM

## 2022-06-18 DIAGNOSIS — F419 Anxiety disorder, unspecified: Secondary | ICD-10-CM

## 2022-06-18 DIAGNOSIS — R202 Paresthesia of skin: Secondary | ICD-10-CM

## 2022-06-18 DIAGNOSIS — G479 Sleep disorder, unspecified: Secondary | ICD-10-CM

## 2022-06-18 DIAGNOSIS — Z91199 Patient's noncompliance with other medical treatment and regimen due to unspecified reason: Secondary | ICD-10-CM
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
## 2022-06-18 NOTE — Patient Instructions (Signed)
No follow-ups on file.        Great to see you today.  I have refilled the medication(s) we provide.   If labs were collected, we will inform you of lab results once received either by echart message or telephone call.   - echart message- for normal results that have been seen by the patient already.   - telephone call: abnormal results or if patient has not viewed results in their echart.  

## 2022-06-25 ENCOUNTER — Encounter: Payer: Self-pay | Admitting: Family Medicine

## 2022-06-25 ENCOUNTER — Ambulatory Visit (INDEPENDENT_AMBULATORY_CARE_PROVIDER_SITE_OTHER): Payer: 59 | Admitting: Family Medicine

## 2022-06-25 VITALS — BP 106/66 | HR 64 | Temp 97.4°F | Ht 59.75 in | Wt 117.0 lb

## 2022-06-25 DIAGNOSIS — G479 Sleep disorder, unspecified: Secondary | ICD-10-CM

## 2022-06-25 DIAGNOSIS — K219 Gastro-esophageal reflux disease without esophagitis: Secondary | ICD-10-CM

## 2022-06-25 DIAGNOSIS — F419 Anxiety disorder, unspecified: Secondary | ICD-10-CM

## 2022-06-25 DIAGNOSIS — R69 Illness, unspecified: Secondary | ICD-10-CM | POA: Diagnosis not present

## 2022-06-25 DIAGNOSIS — Z23 Encounter for immunization: Secondary | ICD-10-CM | POA: Diagnosis not present

## 2022-06-25 MED ORDER — ESCITALOPRAM OXALATE 10 MG PO TABS: 10.0000 mg | ORAL_TABLET | Freq: Every day | ORAL | 1 refills | Status: DC

## 2022-06-25 MED ORDER — OMEPRAZOLE 20 MG PO CPDR: 40.0000 mg | DELAYED_RELEASE_CAPSULE | Freq: Every day | ORAL | 2 refills | Status: DC

## 2022-06-25 NOTE — Patient Instructions (Signed)
I am glad you are feeling well.
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
## 2022-07-23 ENCOUNTER — Encounter: Payer: Self-pay | Admitting: Adult Health

## 2022-08-20 ENCOUNTER — Ambulatory Visit: Payer: Self-pay | Admitting: Adult Health

## 2022-08-20 DIAGNOSIS — Z539 Procedure and treatment not carried out, unspecified reason: Secondary | ICD-10-CM

## 2022-08-28 ENCOUNTER — Other Ambulatory Visit: Payer: Self-pay | Admitting: Family Medicine

## 2022-10-16 ENCOUNTER — Ambulatory Visit: Payer: 59 | Admitting: Adult Health

## 2022-10-16 ENCOUNTER — Encounter: Payer: Self-pay | Admitting: Adult Health

## 2022-10-16 VITALS — BP 102/50 | HR 75 | Ht 59.0 in | Wt 122.6 lb

## 2022-10-16 DIAGNOSIS — Z30017 Encounter for initial prescription of implantable subdermal contraceptive: Secondary | ICD-10-CM

## 2022-10-16 DIAGNOSIS — Z30432 Encounter for removal of intrauterine contraceptive device: Secondary | ICD-10-CM | POA: Diagnosis not present

## 2022-10-16 DIAGNOSIS — Z3202 Encounter for pregnancy test, result negative: Secondary | ICD-10-CM | POA: Diagnosis not present

## 2022-10-16 LAB — POCT URINE PREGNANCY: Preg Test, Ur: NEGATIVE

## 2022-10-16 MED ORDER — ETONOGESTREL 68 MG ~~LOC~~ IMPL
68.0000 mg | DRUG_IMPLANT | Freq: Once | SUBCUTANEOUS | Status: AC
  Administered 2022-10-16: 68 mg via SUBCUTANEOUS

## 2022-10-16 NOTE — Patient Instructions (Signed)
Use condoms x 2 weeks, keep clean and dry x 24 hours, no heavy lifting, keep steri strips on x 72 hours, Keep pressure dressing on x 24 hours. Follow up prn problems.  

## 2022-10-16 NOTE — Progress Notes (Signed)
.  post Subjective:     Patient ID: Cindy Thompson, female   DOB: 09-Oct-1985, 37 y.o.   MRN: [id number]  HPI Cindy Thompson is a 37 year old Asian female, married, G4P3, in for IUD removal(has expired) and nexplanon insertion. Interpreter with pt.   Last pap was negative HPV and NILM 06/12/21.  Review of Systems For IUD removal and nexplanon insertion Reviewed past medical,surgical, social and family history. Reviewed medications and allergies.     Objective:   Physical Exam BP (!) 102/50 (BP Location: Right Arm, Patient Position: Sitting, Cuff Size: Normal)   Pulse 75   Ht 4\' 11"  (1.499 m)   Wt 122 lb 9.6 oz (55.6 kg)   BMI 24.76 kg/m  UPT is negative. Consent signed and time out called. Skin warm and dry.Pelvic: external genitalia is normal in appearance no lesions, vagina: pink,urethra has no lesions or masses noted, cervix:  bulbous,no IUD strings seen, used EC brush to tease strings down then used forceps and grasped strings and ask her to cough and IUD easily removed, uterus: normal size, shape and contour, non tender, no masses felt, adnexa: no masses or tenderness noted. Bladder is non tender and no masses felt.   Consent signed, time out called. Left arm cleansed with betadine, and injected with 1.5 cc 2% lidocaine and waited til numb. Nexplanon easily inserted and steri strips applied.Rod easily palpated by provider and pt. Pressure dressing applied.  Fall risk is low    10/16/2022    1:47 PM 06/25/2022    1:29 PM 01/01/2022    1:02 PM  Depression screen PHQ 2/9  Decreased Interest 0 0 0  Down, Depressed, Hopeless 0 0 0  PHQ - 2 Score 0 0 0  Altered sleeping  0 0  Tired, decreased energy  0 0  Change in appetite  0 0  Feeling bad or failure about yourself   0 0  Trouble concentrating  0 0  Moving slowly or fidgety/restless  0 0  Suicidal thoughts  0 0  PHQ-9 Score  0 0     Upstream - 10/16/22 1346       Pregnancy Intention Screening   Does the patient want to become pregnant in the  next year? No    Does the patient's partner want to become pregnant in the next year? No    Would the patient like to discuss contraceptive options today? No      Contraception Wrap Up   Current Method IUD or IUS    End Method Hormonal Implant    Contraception Counseling Provided No    How was the end contraceptive method provided? Provided on site            Examination chaperoned by Celene Squibb LPN  Assessment:     1. Pregnancy test negative  2. Encounter for IUD removal IUD removed  3. Nexplanon insertion Lot [id number] Exp [id number] Use condoms x 2 weeks, keep clean and dry x 24 hours, no heavy lifting, keep steri strips on x 72 hours, Keep pressure dressing on x 24 hours. Follow up prn problems.      Plan:    Return in 1 year for pap and physical   Nexplanon removal in 3 years or sooner if desired

## 2022-10-25 ENCOUNTER — Encounter: Payer: Self-pay | Admitting: Emergency Medicine

## 2022-10-25 ENCOUNTER — Ambulatory Visit
Admission: EM | Admit: 2022-10-25 | Discharge: 2022-10-25 | Disposition: A | Discharge status: Home / Self Care | Payer: 59 | Attending: Family Medicine | Admitting: Family Medicine

## 2022-10-25 DIAGNOSIS — R197 Diarrhea, unspecified: Secondary | ICD-10-CM | POA: Diagnosis not present

## 2022-10-25 DIAGNOSIS — R112 Nausea with vomiting, unspecified: Secondary | ICD-10-CM

## 2022-10-25 MED ORDER — LOPERAMIDE HCL 2 MG PO CAPS: 2.0000 mg | ORAL_CAPSULE | Freq: Four times a day (QID) | ORAL | 0 refills | Status: DC | PRN

## 2022-10-25 MED ORDER — ONDANSETRON 4 MG PO TBDP: 4.0000 mg | ORAL_TABLET | Freq: Three times a day (TID) | ORAL | 0 refills | Status: DC | PRN

## 2022-10-25 MED ORDER — ONDANSETRON 4 MG PO TBDP
4.0000 mg | ORAL_TABLET | Freq: Once | ORAL | Status: AC
  Administered 2022-10-25: 4 mg via ORAL

## 2022-10-25 NOTE — ED Triage Notes (Signed)
Abd pain since Tuesday.  Feels nauseated and had vomiting on Wednesday.  Diarrhea yesterday.  Has tried to take acid reflux medication and anti-diarrhea medication.

## 2022-10-25 NOTE — ED Provider Notes (Signed)
[facility]    CSN: [id number] Arrival date & time: 10/25/22  1659      History   Chief Complaint No chief complaint on file.   HPI Cindy Thompson is a 37 y.o. female.   Presenting today with 2-day history of upper abdominal pain, nausea, vomiting, diarrhea, body aches.  Denies upper respiratory symptoms, chest pain, shortness of breath, urinary symptoms vaginal symptoms.  Trying some reflux medication antidiarrheals with no relief.  Tolerating p.o. fairly well but states it leads to diarrhea almost immediately.  No known sick contacts recently.  No new foods or medications.  No concerns for pregnancy, has contraceptive implant.    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 No pertinent surgical history.  OB History     Gravida  4   Para  3   Term  3   Preterm      AB  1   Living  3      SAB  1   IAB      Ectopic      Multiple      Live Births  3            Home Medications    Prior to Admission medications   Medication Sig Start Date End Date Taking? Authorizing Provider  loperamide (IMODIUM) 2 MG capsule  Take 1 capsule (2 mg total) by mouth 4 (four) times daily as needed for diarrhea or loose stools. 10/25/22  Yes Volney American, PA-C  ondansetron (ZOFRAN-ODT) 4 MG disintegrating tablet Take 1 tablet (4 mg total) by mouth every 8 (eight) hours as needed for nausea or vomiting. 10/25/22  Yes Volney American, PA-C  escitalopram (LEXAPRO) 10 MG tablet Take 1 tablet (10 mg total) by mouth daily. 06/25/22   Kuneff, Renee A, DO  fexofenadine (ALLEGRA) 180 MG tablet 1 tab in the morning Patient not taking: Reported on 10/16/2022 05/02/22   Howard Pouch A, DO  levocetirizine (XYZAL) 5 MG tablet Take 1 tablet (5 mg total) by mouth every evening. Patient not taking: Reported on 10/16/2022 05/02/22   Howard Pouch A, DO  omeprazole (PRILOSEC) 20 MG capsule Take 2 capsules (40 mg total) by mouth daily. Patient not taking: Reported on 10/16/2022 06/25/22   Howard Pouch A, DO  triamcinolone cream (KENALOG) 0.1 % Apply 1 Application topically 2 (two) times daily. Patient not taking: Reported on 10/16/2022 03/28/22   Ma Hillock, DO    Family History Family History  Problem Relation Age of Onset   Suicidality Father    Stroke Sister    Ovarian cancer Paternal Grandmother  Social History Social History   Tobacco Use   Smoking status: Never   Smokeless tobacco: Never  Vaping Use   Vaping Use: Never used  Substance Use Topics   Alcohol use: No   Drug use: No     Allergies   Crab [shellfish allergy]   Review of Systems Review of Systems PER HPI  Physical Exam Triage Vital Signs ED Triage Vitals  Enc Vitals Group     BP 10/25/22 1704 110/66     Pulse Rate 10/25/22 1704 78     Resp 10/25/22 1704 18     Temp 10/25/22 1704 98.1 F (36.7 C)     Temp Source 10/25/22 1704 Oral     SpO2 10/25/22 1704 99 %     Weight --      Height --      Head Circumference --      Peak Flow --      Pain Score 10/25/22 1705 3     Pain Loc --      Pain Edu? --      Excl. in [place]? --    No  data found.  Updated Vital Signs BP 110/66 (BP Location: Right Arm)   Pulse 78   Temp 98.1 F (36.7 C) (Oral)   Resp 18   SpO2 99%   Visual Acuity Right Eye Distance:   Left Eye Distance:   Bilateral Distance:    Right Eye Near:   Left Eye Near:    Bilateral Near:     Physical Exam Vitals and nursing note reviewed.  Constitutional:      Appearance: Normal appearance. She is not ill-appearing.  HENT:     Head: Atraumatic.     Mouth/Throat:     Mouth: Mucous membranes are moist.     Pharynx: Oropharynx is clear.  Eyes:     Extraocular Movements: Extraocular movements intact.     Conjunctiva/sclera: Conjunctivae normal.  Cardiovascular:     Rate and Rhythm: Normal rate and regular rhythm.     Heart sounds: Normal heart sounds.  Pulmonary:     Effort: Pulmonary effort is normal.     Breath sounds: Normal breath sounds.  Abdominal:     General: Bowel sounds are normal. There is no distension.     Palpations: Abdomen is soft.     Tenderness: There is no abdominal tenderness. There is no guarding.  Musculoskeletal:        General: Normal range of motion.     Cervical back: Normal range of motion and neck supple.  Skin:    General: Skin is warm and dry.  Neurological:     Mental Status: She is alert and oriented to person, place, and time.  Psychiatric:        Mood and Affect: Mood normal.        Thought Content: Thought content normal.        Judgment: Judgment normal.      UC Treatments / Results  Labs (all labs ordered are listed, but only abnormal results are displayed) Labs Reviewed - No data to display  EKG   Radiology No results found.  Procedures Procedures (including critical care time)  Medications Ordered in UC Medications  ondansetron (ZOFRAN-ODT) disintegrating tablet 4 mg (4 mg Oral Given 10/25/22 1743)    Initial Impression / Assessment and Plan / UC Course  I have reviewed the triage vital signs and the nursing notes.  Pertinent  labs & imaging results that  were available during my care of the patient were reviewed by me and considered in my medical decision making (see chart for details).     Will give a dose of Zofran in clinic for active nausea, suspect viral GI illness causing her symptoms.  Treat with Zofran, Imodium, fluids, bland foods.  Return for worsening symptoms.  Final Clinical Impressions(s) / UC Diagnoses   Final diagnoses:  Nausea vomiting and diarrhea   Discharge Instructions   None    ED Prescriptions     Medication Sig Dispense Auth. Provider   ondansetron (ZOFRAN-ODT) 4 MG disintegrating tablet Take 1 tablet (4 mg total) by mouth every 8 (eight) hours as needed for nausea or vomiting. 20 tablet Particia Nearing, [place]   loperamide (IMODIUM) 2 MG capsule Take 1 capsule (2 mg total) by mouth 4 (four) times daily as needed for diarrhea or loose stools. 12 capsule Particia Nearing, [place]      PDMP not reviewed this encounter.   Particia Nearing, [place] 10/25/22 1745

## 2022-10-26 ENCOUNTER — Telehealth: Payer: Self-pay

## 2022-10-26 NOTE — Telephone Encounter (Signed)
Pt called and said that the loperamide she was prescribed yesterday helped to improve her diarrhea  but now its causing her to have body aches, headahes, and sob.    Per provider, pt can stop taking the medication and increase fluids. If problems persist please to come back and get reevaluated. Pt verbally said she understood what she was supposed to do.

## 2022-10-30 ENCOUNTER — Encounter: Payer: Self-pay | Admitting: Family Medicine

## 2022-10-30 ENCOUNTER — Ambulatory Visit: Payer: 59 | Admitting: Family Medicine

## 2022-10-30 ENCOUNTER — Telehealth: Payer: Self-pay | Admitting: Family Medicine

## 2022-10-30 VITALS — BP 103/64 | HR 68 | Temp 97.5°F | Wt 124.0 lb

## 2022-10-30 DIAGNOSIS — E860 Dehydration: Secondary | ICD-10-CM

## 2022-10-30 DIAGNOSIS — R42 Dizziness and giddiness: Secondary | ICD-10-CM | POA: Diagnosis not present

## 2022-10-30 DIAGNOSIS — Z789 Other specified health status: Secondary | ICD-10-CM | POA: Insufficient documentation

## 2022-10-30 LAB — COMPREHENSIVE METABOLIC PANEL
ALT: 16 U/L (ref 0–35)
AST: 15 U/L (ref 0–37)
Albumin: 4.5 g/dL (ref 3.5–5.2)
Alkaline Phosphatase: 42 U/L (ref 39–117)
BUN: 13 mg/dL (ref 6–23)
CO2: 26 mEq/L (ref 19–32)
Calcium: 9.2 mg/dL (ref 8.4–10.5)
Chloride: 105 mEq/L (ref 96–112)
Creatinine, Ser: 0.58 mg/dL (ref 0.40–1.20)
GFR: 114.56 mL/min (ref >60.00)
Glucose, Bld: 103 mg/dL — ABNORMAL HIGH (ref 70–99)
Potassium: 4.2 mEq/L (ref 3.5–5.1)
Sodium: 139 mEq/L (ref 135–145)
Total Bilirubin: 0.4 mg/dL (ref 0.2–1.2)
Total Protein: 7.3 g/dL (ref 6.0–8.3)

## 2022-10-30 NOTE — Progress Notes (Signed)
Cindy Thompson , 08-16-1986, 37 y.o., female MRN: [id number] Patient Care Team    Relationship Specialty Notifications Start End  Ma Hillock, DO PCP - General Family Medicine  04/11/16   Roma Schanz, [place] Midwife Obstetrics and Gynecology  03/06/21     Chief Complaint  Patient presents with   Abdominal Pain    No pain at this time. Went to ED 01/25   Interpreter present today for OV  Subjective: Cindy Thompson is a 37 y.o. female present for stomach pain. She was seen in the ED 10/25/2022 with nausea, vomit and diarrhea. Her husband also had similar symptoms. Symptoms last about 7 days, the resolved. She endorses continue intermittent dizziness since illness. She is not drinking much fluids in the day.     10/16/2022    1:47 PM 06/25/2022    1:29 PM 01/01/2022    1:02 PM 06/12/2021    1:50 PM 03/06/2021    1:03 PM  Depression screen PHQ 2/9  Decreased Interest 0 0 0 0 0  Down, Depressed, Hopeless 0 0 0 0 0  PHQ - 2 Score 0 0 0 0 0  Altered sleeping  0 0 0   Tired, decreased energy  0 0 0   Change in appetite  0 0 0   Feeling bad or failure about yourself   0 0 0   Trouble concentrating  0 0 0   Moving slowly or fidgety/restless  0 0 0   Suicidal thoughts  0 0 0   PHQ-9 Score  0 0 0       06/25/2022    1:29 PM 01/01/2022    1:10 PM 06/12/2021    1:51 PM  GAD 7 : Generalized Anxiety Score  Nervous, Anxious, on Edge 0 0 0  Control/stop worrying 0 0 0  Worry too much - different things 0 0 0  Trouble relaxing 0 0 0  Restless 0 0 0  Easily annoyed or irritable 0 0 0  Afraid - awful might happen 0 0 0  Total GAD 7 Score 0 0 0     Allergies  Allergen Reactions   Crab [Shellfish Allergy] Rash   Social History   Social History Narrative   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 She lives in [place]. Her mother helps her with children.    Past Medical History:  Diagnosis Date   Allergic urticaria 02/04/2018   Allergy    Angio-edema     COVID-19 virus detected 04/10/2019   W/ sob and hemoptysis    Eczema 03/06/2021   GERD (gastroesophageal reflux disease)    Lateral epicondylitis of right elbow 11/18/2017   Thyroid disease    thyroid nodules   Urticaria    History reviewed. No pertinent surgical history. Family History  Problem Relation Age of Onset   Suicidality Father    Stroke Sister    Ovarian cancer Paternal Grandmother    Allergies as of 10/30/2022       Reactions   Crab [shellfish Allergy] Rash        Medication List        Accurate as of October 30, 2022 10:53 AM. If you have any questions, ask your nurse or doctor.          ALIGN PO Take by mouth.   escitalopram 10 MG tablet Commonly known as: Lexapro Take 1 tablet (10 mg total) by mouth daily.  fexofenadine 180 MG tablet Commonly known as: ALLEGRA 1 tab in the morning   levocetirizine 5 MG tablet Commonly known as: XYZAL Take 1 tablet (5 mg total) by mouth every evening.   loperamide 2 MG capsule Commonly known as: IMODIUM Take 1 capsule (2 mg total) by mouth 4 (four) times daily as needed for diarrhea or loose stools.   omeprazole 20 MG capsule Commonly known as: PRILOSEC Take 2 capsules (40 mg total) by mouth daily.   ondansetron 4 MG disintegrating tablet Commonly known as: ZOFRAN-ODT Take 1 tablet (4 mg total) by mouth every 8 (eight) hours as needed for nausea or vomiting.   triamcinolone cream 0.1 % Commonly known as: KENALOG Apply 1 Application topically 2 (two) times daily.        All past medical history, surgical history, allergies, family history, immunizations andmedications were updated in the EMR today and reviewed under the history and medication portions of their EMR.     ROS Negative, with the exception of above mentioned in HPI   Objective:  BP 103/64   Pulse 68   Temp (!) 97.5 F (36.4 C)   Wt 124 lb (56.2 kg)   SpO2 98%   BMI 25.04 kg/m  Body mass index is 25.04 kg/m. Physical  Exam Vitals and nursing note reviewed.  Constitutional:      General: She is not in acute distress.    Appearance: Normal appearance. She is normal weight. She is not ill-appearing or toxic-appearing.  HENT:     Head: Normocephalic and atraumatic.  Eyes:     General: No scleral icterus.       Right eye: No discharge.        Left eye: No discharge.     Extraocular Movements: Extraocular movements intact.     Conjunctiva/sclera: Conjunctivae normal.     Pupils: Pupils are equal, round, and reactive to light.  Cardiovascular:     Rate and Rhythm: Normal rate and regular rhythm.  Pulmonary:     Effort: Pulmonary effort is normal.     Breath sounds: Normal breath sounds.  Abdominal:     General: Abdomen is flat. Bowel sounds are normal. There is no distension.     Palpations: Abdomen is soft. There is no mass.     Tenderness: There is no abdominal tenderness. There is no guarding or rebound.     Hernia: No hernia is present.  Skin:    Findings: No rash.  Neurological:     Mental Status: She is alert and oriented to person, place, and time. Mental status is at baseline.     Motor: No weakness.     Coordination: Coordination normal.     Gait: Gait normal.  Psychiatric:        Mood and Affect: Mood normal.        Behavior: Behavior normal.        Thought Content: Thought content normal.        Judgment: Judgment normal.    No results found. No results found. No results found for this or any previous visit (from the past 24 hour(s)).  Assessment/Plan: Cindy Thompson is a 37 y.o. female present for OV for  Dizziness/Dehydration Discussed adequate hydration - encouraged her to increase fluid (G2/water) at least 60 ounces daily. Currently she drinks about 20 ounces a day at most.  Will check lytes s/p gastro virus.  - Comp Met (CMET)  Language barrier: Interpreter present today for OV  Reviewed expectations re:  course of current medical issues. Discussed self-management of  symptoms. Outlined signs and symptoms indicating need for more acute intervention. Patient verbalized understanding and all questions were answered. Patient received an After-Visit Summary.    Orders Placed This Encounter  Procedures   Comp Met (CMET)   No orders of the defined types were placed in this encounter.  Referral Orders  No referral(s) requested today     Note is dictated utilizing voice recognition software. Although note has been proof read prior to signing, occasional typographical errors still can be missed. If any questions arise, please do not hesitate to call for verification.   electronically signed by:  Howard Pouch, DO  [place]

## 2022-10-30 NOTE — Patient Instructions (Signed)
Return if symptoms worsen or fail to improve.        Great to see you today.  I have refilled the medication(s) we provide.   If labs were collected, we will inform you of lab results once received either by echart message or telephone call.   - echart message- for normal results that have been seen by the patient already.   - telephone call: abnormal results or if patient has not viewed results in their echart.  

## 2022-10-30 NOTE — Telephone Encounter (Signed)
Please inform patient her electrolytes are normal. I recommend she work on hydrating well as we discussed today to help with her dizziness.

## 2022-10-31 NOTE — Telephone Encounter (Signed)
Attempted to contact pt and was unable to LVM  Interpreter [phone]

## 2022-11-01 NOTE — Telephone Encounter (Signed)
Spoke with pt regarding labs and instructions.   

## 2022-11-09 ENCOUNTER — Ambulatory Visit: Payer: 59 | Admitting: Family Medicine

## 2022-12-10 ENCOUNTER — Ambulatory Visit: Payer: 59 | Admitting: Family Medicine

## 2022-12-27 DIAGNOSIS — R0600 Dyspnea, unspecified: Secondary | ICD-10-CM | POA: Diagnosis not present

## 2022-12-27 DIAGNOSIS — R519 Headache, unspecified: Secondary | ICD-10-CM | POA: Diagnosis not present

## 2022-12-28 DIAGNOSIS — R519 Headache, unspecified: Secondary | ICD-10-CM | POA: Diagnosis not present

## 2023-01-14 ENCOUNTER — Ambulatory Visit: Payer: 59 | Admitting: Family Medicine

## 2023-01-14 ENCOUNTER — Encounter: Payer: Self-pay | Admitting: Family Medicine

## 2023-01-14 VITALS — BP 100/68 | HR 60 | Wt 111.6 lb

## 2023-01-14 DIAGNOSIS — F419 Anxiety disorder, unspecified: Secondary | ICD-10-CM | POA: Diagnosis not present

## 2023-01-14 DIAGNOSIS — G479 Sleep disorder, unspecified: Secondary | ICD-10-CM

## 2023-01-14 DIAGNOSIS — K219 Gastro-esophageal reflux disease without esophagitis: Secondary | ICD-10-CM | POA: Diagnosis not present

## 2023-01-14 MED ORDER — ESCITALOPRAM OXALATE 10 MG PO TABS: 10.0000 mg | ORAL_TABLET | Freq: Every day | ORAL | 1 refills | Status: DC

## 2023-01-14 MED ORDER — TRIAMCINOLONE ACETONIDE 0.1 % EX CREA: 1.0000 | TOPICAL_CREAM | Freq: Two times a day (BID) | CUTANEOUS | 5 refills | Status: DC

## 2023-01-14 MED ORDER — LEVOCETIRIZINE DIHYDROCHLORIDE 5 MG PO TABS: 5.0000 mg | ORAL_TABLET | Freq: Every evening | ORAL | 3 refills | Status: DC

## 2023-01-14 MED ORDER — OMEPRAZOLE 20 MG PO CPDR: 40.0000 mg | DELAYED_RELEASE_CAPSULE | Freq: Every day | ORAL | 2 refills | Status: DC

## 2023-01-14 NOTE — Patient Instructions (Addendum)
Return in about 24 weeks (around 07/01/2023) for cpe (20 min), Routine chronic condition follow-up.        Great to see you today.  I have refilled the medication(s) we provide.   If labs were collected, we will inform you of lab results once received either by echart message or telephone call.   - echart message- for normal results that have been seen by the patient already.   - telephone call: abnormal results or if patient has not viewed results in their echart.
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
## 2023-02-07 ENCOUNTER — Encounter: Payer: Self-pay | Admitting: Family Medicine

## 2023-02-07 ENCOUNTER — Ambulatory Visit (INDEPENDENT_AMBULATORY_CARE_PROVIDER_SITE_OTHER): Payer: 59 | Admitting: Family Medicine

## 2023-02-07 ENCOUNTER — Other Ambulatory Visit (HOSPITAL_COMMUNITY)
Admission: RE | Admit: 2023-02-07 | Discharge: 2023-02-07 | Disposition: A | Discharge status: Home / Self Care | Payer: 59 | Source: Ambulatory Visit | Attending: Family Medicine | Admitting: Family Medicine

## 2023-02-07 VITALS — BP 86/59 | HR 54 | Temp 97.6°F | Wt 112.2 lb

## 2023-02-07 DIAGNOSIS — N898 Other specified noninflammatory disorders of vagina: Secondary | ICD-10-CM

## 2023-02-07 MED ORDER — FLUCONAZOLE 150 MG PO TABS: ORAL_TABLET | ORAL | 0 refills | Status: DC

## 2023-02-07 NOTE — Progress Notes (Signed)
Cindy Thompson , 1986/04/06, 37 y.o., female MRN: [id number] Patient Care Team    Relationship Specialty Notifications Start End  Natalia Leatherwood, DO PCP - General Family Medicine  04/11/16   Cheral Marker, CNM Midwife Obstetrics and Gynecology  03/06/21     Chief Complaint  Patient presents with   Vaginal Itching    Started 2 weeks; used vaginal cream for yeast and calmed but did not go away.     Subjective: Cindy Thompson is a 37 y.o. Pt presents for an OV with complaints of vaginal irritation of 2 weeks duration.  Associated symptoms include yellow/white vaginal discharge.  Pt has tried OTC antifungal cream to ease their symptoms. She states the cream help temporarily, but burned a great deal.       01/14/2023    1:10 PM 10/16/2022    1:47 PM 06/25/2022    1:29 PM 01/01/2022    1:02 PM 06/12/2021    1:50 PM  Depression screen PHQ 2/9  Decreased Interest 0 0 0 0 0  Down, Depressed, Hopeless 0 0 0 0 0  PHQ - 2 Score 0 0 0 0 0  Altered sleeping 0  0 0 0  Tired, decreased energy 0  0 0 0  Change in appetite 0  0 0 0  Feeling bad or failure about yourself  0  0 0 0  Trouble concentrating 0  0 0 0  Moving slowly or fidgety/restless 0  0 0 0  Suicidal thoughts 0  0 0 0  PHQ-9 Score 0  0 0 0  Difficult doing work/chores Not difficult at all        Allergies  Allergen Reactions   Crab [Shellfish Allergy] Rash   Social History   Social History Narrative   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 She lives in [place]. Her mother helps her with children.    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 No pertinent surgical history. Family History  Problem  Relation Age of Onset   Suicidality Father    Stroke Sister    Ovarian cancer Paternal Grandmother    Allergies as of 02/07/2023       Reactions   Crab [shellfish Allergy] Rash        Medication List        Accurate as of Feb 07, 2023  9:58 AM. If you have any questions, ask your nurse or doctor.          ALIGN PO Take by mouth.   escitalopram 10 MG tablet Commonly known as: Lexapro Take 1 tablet (10 mg total) by mouth daily.   fluconazole 150 MG tablet Commonly known as: DIFLUCAN 1 tab p.o., repeat dose in 1 week Started by: Felix Pacini, DO   levocetirizine 5 MG tablet Commonly known as: XYZAL Take 1 tablet (5 mg total) by mouth every evening.   omeprazole 20 MG capsule Commonly known as: PRILOSEC Take 2 capsules (40 mg total) by mouth daily.   triamcinolone cream 0.1 % Commonly known as: KENALOG Apply 1 Application topically 2 (two)  times daily.        All past medical history, surgical history, allergies, family history, immunizations andmedications were updated in the EMR today and reviewed under the history and medication portions of their EMR.     Review of Systems  Constitutional:  Negative for chills, fever and malaise/fatigue.  HENT: Negative.    Eyes: Negative.   Respiratory: Negative.    Cardiovascular: Negative.   Gastrointestinal: Negative.   Genitourinary:  Negative for dysuria, flank pain, frequency and urgency.  Skin: Negative.   Neurological: Negative.    Negative, with the exception of above mentioned in HPI   Objective:  BP (!) 86/59   Pulse (!) 54   Temp 97.6 F (36.4 C)   Wt 112 lb 3.2 oz (50.9 kg)   SpO2 96%   BMI 22.66 kg/m  Body mass index is 22.66 kg/m.  Physical Exam Vitals and nursing note reviewed.  Constitutional:      General: She is not in acute distress.    Appearance: Normal appearance. She is normal weight. She is not ill-appearing or toxic-appearing.  HENT:     Head: Normocephalic and atraumatic.   Eyes:     General: No scleral icterus.       Right eye: No discharge.        Left eye: No discharge.     Extraocular Movements: Extraocular movements intact.     Conjunctiva/sclera: Conjunctivae normal.     Pupils: Pupils are equal, round, and reactive to light.  Abdominal:     General: Abdomen is flat.     Palpations: Abdomen is soft.     Tenderness: There is no abdominal tenderness.  Skin:    Findings: No rash.  Neurological:     Mental Status: She is alert and oriented to person, place, and time. Mental status is at baseline.     Motor: No weakness.     Coordination: Coordination normal.     Gait: Gait normal.  Psychiatric:        Mood and Affect: Mood normal.        Behavior: Behavior normal.        Thought Content: Thought content normal.        Judgment: Judgment normal.      No results found. No results found. No results found for this or any previous visit (from the past 24 hour(s)).  Assessment/Plan: Cindy Thompson is a 37 y.o. female present for OV for  Vaginal itching/discharge Diflucan prescribed.  - Urine cytology ancillary only([facility]) Avoid scented soaps, bubble bath and panty liners etc. Pt will be called with results and further plan.  Reviewed expectations re: course of current medical issues. Discussed self-management of symptoms. Outlined signs and symptoms indicating need for more acute intervention. Patient verbalized understanding and all questions were answered. Patient received an After-Visit Summary.    No orders of the defined types were placed in this encounter.  Meds ordered this encounter  Medications   fluconazole (DIFLUCAN) 150 MG tablet    Sig: 1 tab p.o., repeat dose in 1 week    Dispense:  2 tablet    Refill:  0   Referral Orders  No referral(s) requested today     Note is dictated utilizing voice recognition software. Although note has been proof read prior to signing, occasional typographical errors still can be missed.  If any questions arise, please do not hesitate to call for verification.   electronically signed by:  Felix Pacini, DO  [facility] - [place]

## 2023-02-08 ENCOUNTER — Telehealth: Payer: Self-pay | Admitting: Family Medicine

## 2023-02-08 LAB — URINE CYTOLOGY ANCILLARY ONLY
Bacterial Vaginitis-Urine: NEGATIVE
Candida Urine: NEGATIVE
Chlamydia: NEGATIVE
Comment: NEGATIVE
Comment: NEGATIVE
Comment: NORMAL
Neisseria Gonorrhea: NEGATIVE
Trichomonas: NEGATIVE

## 2023-02-08 NOTE — Telephone Encounter (Signed)
Please inform pt  her urine test did not show any sign sof infection.  I recommend she take the medication we prescribed and if symptoms remain, follow up in 2 weeks with her GYN

## 2023-02-08 NOTE — Telephone Encounter (Signed)
Spoke with patient regarding results/recommendations.  

## 2023-03-20 ENCOUNTER — Other Ambulatory Visit: Payer: Self-pay | Admitting: Family Medicine

## 2023-05-16 ENCOUNTER — Encounter (INDEPENDENT_AMBULATORY_CARE_PROVIDER_SITE_OTHER): Payer: Self-pay

## 2023-06-14 ENCOUNTER — Telehealth: Payer: Self-pay | Admitting: Family Medicine

## 2023-06-14 NOTE — Telephone Encounter (Signed)
Patient called to get scheduled for an appointment. I informed her the office did not have anything today or next week with Dr. Claiborne Billings. Offered another office and provider patient declined only wanted to see Dr. Claiborne Billings. She stated that she would take medicine to see if it helped her mouth. She was experiencing sore mouth said looked like an infection which started Tuesday(9/10). I advised to go to Urgent care as well. Patient said she would go to urgent care if not better by Monday. She said dont worry since we were booked.

## 2023-06-14 NOTE — Telephone Encounter (Signed)
noted 

## 2023-07-01 ENCOUNTER — Encounter: Payer: Self-pay | Admitting: Family Medicine

## 2023-07-01 ENCOUNTER — Ambulatory Visit (INDEPENDENT_AMBULATORY_CARE_PROVIDER_SITE_OTHER): Payer: 59 | Admitting: Family Medicine

## 2023-07-01 VITALS — BP 102/62 | HR 68 | Temp 97.8°F | Ht 59.65 in | Wt 114.2 lb

## 2023-07-01 DIAGNOSIS — Z23 Encounter for immunization: Secondary | ICD-10-CM | POA: Diagnosis not present

## 2023-07-01 DIAGNOSIS — K219 Gastro-esophageal reflux disease without esophagitis: Secondary | ICD-10-CM | POA: Diagnosis not present

## 2023-07-01 DIAGNOSIS — Z1231 Encounter for screening mammogram for malignant neoplasm of breast: Secondary | ICD-10-CM

## 2023-07-01 DIAGNOSIS — Z131 Encounter for screening for diabetes mellitus: Secondary | ICD-10-CM | POA: Diagnosis not present

## 2023-07-01 DIAGNOSIS — K5901 Slow transit constipation: Secondary | ICD-10-CM

## 2023-07-01 DIAGNOSIS — K648 Other hemorrhoids: Secondary | ICD-10-CM | POA: Diagnosis not present

## 2023-07-01 DIAGNOSIS — J301 Allergic rhinitis due to pollen: Secondary | ICD-10-CM | POA: Diagnosis not present

## 2023-07-01 DIAGNOSIS — Z13220 Encounter for screening for lipoid disorders: Secondary | ICD-10-CM | POA: Diagnosis not present

## 2023-07-01 DIAGNOSIS — L308 Other specified dermatitis: Secondary | ICD-10-CM | POA: Diagnosis not present

## 2023-07-01 DIAGNOSIS — F419 Anxiety disorder, unspecified: Secondary | ICD-10-CM | POA: Diagnosis not present

## 2023-07-01 DIAGNOSIS — Z0000 Encounter for general adult medical examination without abnormal findings: Secondary | ICD-10-CM

## 2023-07-01 LAB — LIPID PANEL
Cholesterol: 167 mg/dL (ref 0–200)
HDL: 59.8 mg/dL (ref >39.00)
LDL Cholesterol: 92 mg/dL (ref 0–99)
NonHDL: 106.73
Total CHOL/HDL Ratio: 3
Triglycerides: 75 mg/dL (ref 0.0–149.0)
VLDL: 15 mg/dL (ref 0.0–40.0)

## 2023-07-01 LAB — COMPREHENSIVE METABOLIC PANEL
ALT: 17 U/L (ref 0–35)
AST: 14 U/L (ref 0–37)
Albumin: 4.5 g/dL (ref 3.5–5.2)
Alkaline Phosphatase: 49 U/L (ref 39–117)
BUN: 14 mg/dL (ref 6–23)
CO2: 29 meq/L (ref 19–32)
Calcium: 9.4 mg/dL (ref 8.4–10.5)
Chloride: 103 meq/L (ref 96–112)
Creatinine, Ser: 0.69 mg/dL (ref 0.40–1.20)
GFR: 109.35 mL/min (ref >60.00)
Glucose, Bld: 84 mg/dL (ref 70–99)
Potassium: 4 meq/L (ref 3.5–5.1)
Sodium: 139 meq/L (ref 135–145)
Total Bilirubin: 1 mg/dL (ref 0.2–1.2)
Total Protein: 7.2 g/dL (ref 6.0–8.3)

## 2023-07-01 LAB — TSH: TSH: 1.37 u[IU]/mL (ref 0.35–5.50)

## 2023-07-01 LAB — CBC
HCT: 40.7 % (ref 36.0–46.0)
Hemoglobin: 13.4 g/dL (ref 12.0–15.0)
MCHC: 32.8 g/dL (ref 30.0–36.0)
MCV: 96.9 fL (ref 78.0–100.0)
Platelets: 229 10*3/uL (ref 150.0–400.0)
RBC: 4.2 Mil/uL (ref 3.87–5.11)
RDW: 12 % (ref 11.5–15.5)
WBC: 6.5 10*3/uL (ref 4.0–10.5)

## 2023-07-01 LAB — HEMOGLOBIN A1C: Hgb A1c MFr Bld: 5.5 % (ref 4.6–6.5)

## 2023-07-01 MED ORDER — LEVOCETIRIZINE DIHYDROCHLORIDE 5 MG PO TABS: 5.0000 mg | ORAL_TABLET | Freq: Every evening | ORAL | 3 refills | Status: DC

## 2023-07-01 MED ORDER — TRIAMCINOLONE ACETONIDE 0.1 % EX CREA: 1.0000 | TOPICAL_CREAM | Freq: Two times a day (BID) | CUTANEOUS | 5 refills | Status: DC

## 2023-07-01 MED ORDER — SENNA-DOCUSATE SODIUM 8.6-50 MG PO TABS: 1.0000 | ORAL_TABLET | Freq: Every day | ORAL | 11 refills | Status: DC

## 2023-07-01 MED ORDER — ESCITALOPRAM OXALATE 10 MG PO TABS: 10.0000 mg | ORAL_TABLET | Freq: Every day | ORAL | 1 refills | Status: DC

## 2023-07-01 MED ORDER — OMEPRAZOLE 20 MG PO CPDR: 40.0000 mg | DELAYED_RELEASE_CAPSULE | Freq: Every day | ORAL | 11 refills | Status: DC

## 2023-07-01 NOTE — Progress Notes (Signed)
Patient ID: Cindy Thompson, female  DOB: 05/29/1986, 37 y.o.   MRN: [id number] Patient Care Team    Relationship Specialty Notifications Start End  Natalia Leatherwood, DO PCP - General Family Medicine  04/11/16   Cheral Marker, CNM Midwife Obstetrics and Gynecology  03/06/21     Chief Complaint  Patient presents with   Annual Exam    Pt is fasting    Subjective: Cindy Thompson is a 37 y.o.  Female  present for CPE. All past medical history, surgical history, allergies, family history, immunizations, medications and social history were updated in the electronic medical record today. All recent labs, ED visits and hospitalizations within the last year were reviewed.  Health maintenance:  Colonoscopy: screen at 43, no fhx Mammogram: start screen at 40, no fhx-ordered for 02/13/2024 Cervical cancer screening: completed with GYN; 2022. K. Booker CNM Immunizations: tdap updated today, influenza updated today(encouraged yearly) Infectious disease screening: HIV completed 2015; hep c completed DEXA: screen at 58 Assistive device: none Oxygen QIH:KVQQ Patient has a Dental home. Hospitalizations/ED visits: Reviewed  Gastroesophageal reflux disease without esophagitis Pt reports condition is stable with use of PPI prn  Anxiety Pt reports compliance with lexapro 10 mg every day and feels medication helps her a great deal.   eczema Uses kenalog cream for flares.   Chronic seasonal allergic rhinitis due to pollen Uses zyrtec nightly.   Slow transit constipation/Other hemorrhoids New problem. She reports having a hemorrhoid present since the birth of her son 13 years ago. She struggles with constipation and hemorrhoid has become larger and bleeds frequently. She reports she does drink water daily and takes a stool softener when needed. She states sometimes the hemorrhoid is located on the inside and sometimes it hangs out.       07/01/2023    1:04 PM 02/07/2023    3:34 PM 01/14/2023    1:10 PM  10/16/2022    1:47 PM 06/25/2022    1:29 PM  Depression screen PHQ 2/9  Decreased Interest 0 0 0 0 0  Down, Depressed, Hopeless 0 0 0 0 0  PHQ - 2 Score 0 0 0 0 0  Altered sleeping 0 0 0  0  Tired, decreased energy 0 0 0  0  Change in appetite 0 0 0  0  Feeling bad or failure about yourself  0 0 0  0  Trouble concentrating 0 0 0  0  Moving slowly or fidgety/restless 0 0 0  0  Suicidal thoughts 0 0 0  0  PHQ-9 Score 0 0 0  0  Difficult doing work/chores Not difficult at all Not difficult at all Not difficult at all        07/01/2023    1:04 PM 01/14/2023    1:10 PM 06/25/2022    1:29 PM 01/01/2022    1:10 PM  GAD 7 : Generalized Anxiety Score  Nervous, Anxious, on Edge 0 0 0 0  Control/stop worrying 0 0 0 0  Worry too much - different things 0 0 0 0  Trouble relaxing 0 0 0 0  Restless 0 0 0 0  Easily annoyed or irritable 0 0 0 0  Afraid - awful might happen 0 0 0 0  Total GAD 7 Score 0 0 0 0  Anxiety Difficulty Not difficult at all Not difficult at all       Immunization History  Administered Date(s) Administered   Influenza, Seasonal, Injecte, Preservative Fre  07/01/2023   Influenza,inj,Quad PF,6+ Mos 07/12/2014, 06/25/2022   Influenza,inj,quad, With Preservative 07/20/2019   PFIZER(Purple Top)SARS-COV-2 Vaccination 12/31/2019, 01/22/2020, 09/09/2020   Tdap 01/13/2013, 07/01/2023   Past Medical History:  Diagnosis Date   Acne vulgaris 03/04/2015   Allergic urticaria 02/04/2018   Allergy    Angio-edema    COVID-19 virus detected 04/10/2019   W/ sob and hemoptysis    Eczema 03/06/2021   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 No pertinent surgical history. 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 She lives in [place]. Her mother helps her with children.     Allergies as of 07/01/2023       Reactions   Crab [shellfish Allergy] Rash        Medication List        Accurate as of July 01, 2023  1:28 PM. If you have any questions, ask your nurse or doctor.          STOP taking these medications    fluconazole 150 MG tablet Commonly known as: DIFLUCAN Stopped by: Felix Pacini       TAKE these medications    ALIGN PO Take by mouth.   escitalopram 10 MG tablet Commonly known as: Lexapro Take 1 tablet (10 mg total) by mouth daily.   levocetirizine 5 MG tablet Commonly known as: XYZAL Take 1 tablet (5 mg total) by mouth every evening.   omeprazole 20 MG capsule Commonly known as: PRILOSEC Take 2 capsules (40 mg total) by mouth daily.   sennosides-docusate sodium 8.6-50 MG tablet Commonly known as: SENOKOT-S Take 1-2 tablets by mouth at bedtime. Started by: Felix Pacini   triamcinolone cream 0.1 % Commonly known as: KENALOG Apply 1 Application topically 2 (two) times daily.        All past medical history, surgical history, allergies, family history, immunizations andmedications were updated in the EMR today and reviewed under the history and medication portions of their EMR.     No results found for this or any previous visit (from the past 2160 hour(s)).   ROS: 14 pt review of systems performed and negative (unless mentioned in an HPI)  Objective: BP 102/62   Pulse 68   Temp 97.8 F (36.6 C)   Ht 4' 11.65" (1.515 m)   Wt 114 lb 3.2 oz (51.8 kg)   LMP 06/24/2023   SpO2 97%   BMI 22.57 kg/m  Physical Exam Vitals and nursing note reviewed.  Constitutional:      General: She is not in acute distress.    Appearance: Normal appearance.  She is not ill-appearing or toxic-appearing.  HENT:     Head: Normocephalic and atraumatic.     Right Ear: Tympanic membrane, ear canal and external ear normal. There is no impacted cerumen.     Left Ear: Tympanic membrane, ear canal and external ear normal. There is no impacted cerumen.     Nose: No congestion or rhinorrhea.     Mouth/Throat:  Mouth: Mucous membranes are moist.     Pharynx: Oropharynx is clear. No oropharyngeal exudate or posterior oropharyngeal erythema.  Eyes:     General: No scleral icterus.       Right eye: No discharge.        Left eye: No discharge.     Extraocular Movements: Extraocular movements intact.     Conjunctiva/sclera: Conjunctivae normal.     Pupils: Pupils are equal, round, and reactive to light.  Cardiovascular:     Rate and Rhythm: Normal rate and regular rhythm.     Pulses: Normal pulses.     Heart sounds: Normal heart sounds. No murmur heard.    No friction rub. No gallop.  Pulmonary:     Effort: Pulmonary effort is normal. No respiratory distress.     Breath sounds: Normal breath sounds. No stridor. No wheezing, rhonchi or rales.  Chest:     Chest wall: No tenderness.  Abdominal:     General: Abdomen is flat. Bowel sounds are normal. There is no distension.     Palpations: Abdomen is soft. There is no mass.     Tenderness: There is no abdominal tenderness. There is no right CVA tenderness, left CVA tenderness, guarding or rebound.     Hernia: No hernia is present.  Musculoskeletal:        General: No swelling, tenderness or deformity. Normal range of motion.     Cervical back: Normal range of motion and neck supple. No rigidity or tenderness.     Right lower leg: No edema.     Left lower leg: No edema.  Lymphadenopathy:     Cervical: No cervical adenopathy.  Skin:    General: Skin is warm and dry.     Coloration: Skin is not jaundiced or pale.     Findings: No bruising, erythema, lesion or rash.  Neurological:     General: No  focal deficit present.     Mental Status: She is alert and oriented to person, place, and time. Mental status is at baseline.     Cranial Nerves: No cranial nerve deficit.     Sensory: No sensory deficit.     Motor: No weakness.     Coordination: Coordination normal.     Gait: Gait normal.     Deep Tendon Reflexes: Reflexes normal.  Psychiatric:        Mood and Affect: Mood normal.        Behavior: Behavior normal.        Thought Content: Thought content normal.        Judgment: Judgment normal.       No results found.  Assessment/plan: Elois Averitt is a 37 y.o. female present for CPE  Gastroesophageal reflux disease, unspecified whether esophagitis present -Stable. -Continue omeprazole prn Eczema:  Continue Kenalog cream PRN prescribed.   Anxiety: Stable. Continue Lexapro 10 mg daily  Breast cancer screening by mammogram - MM 3D SCREENING MAMMOGRAM BILATERAL BREAST; Future Influenza vaccine needed Updated today Need for Tdap vaccination Updated today  Lipid screening - Lipid panel Diabetes mellitus screening - Hemoglobin A1c  Slow transit constipation Continue hydration Start senakot-s nightly  hemorrhoids Referral to Gen surg to eval hemorrhoids.  Bleeding frequently.    Routine general medical examination at a health care facility Patient was encouraged to exercise greater than 150 minutes a week. Patient was encouraged to choose a diet filled with fresh fruits and vegetables, and lean meats. AVS provided to patient today for education/recommendation on  gender specific health and safety maintenance. - Comprehensive metabolic panel - TSH - CBC Colonoscopy: screen at 45, no fhx Mammogram: start screen at 40, no fhx-ordered for 02/13/2024 Cervical cancer screening: completed with GYN; 2022. K. Booker CNM Immunizations: tdap updated today, influenza updated today(encouraged yearly) Infectious disease screening: HIV completed 2015; hep c completed DEXA: screen  at 60  Return in about 24 weeks (around 12/16/2023) for Routine chronic condition follow-up.  Orders Placed This Encounter  Procedures   MM 3D SCREENING MAMMOGRAM BILATERAL BREAST   Tdap vaccine greater than or equal to 7yo IM   Flu vaccine trivalent PF, 6mos and older(Flulaval,Afluria,Fluarix,Fluzone)   Comprehensive metabolic panel   Hemoglobin A1c   TSH   Lipid panel   CBC   Ambulatory referral to General Surgery   Meds ordered this encounter  Medications   escitalopram (LEXAPRO) 10 MG tablet    Sig: Take 1 tablet (10 mg total) by mouth daily.    Dispense:  90 tablet    Refill:  1   omeprazole (PRILOSEC) 20 MG capsule    Sig: Take 2 capsules (40 mg total) by mouth daily.    Dispense:  60 capsule    Refill:  11    Hold until pt request   levocetirizine (XYZAL) 5 MG tablet    Sig: Take 1 tablet (5 mg total) by mouth every evening.    Dispense:  90 tablet    Refill:  3   triamcinolone cream (KENALOG) 0.1 %    Sig: Apply 1 Application topically 2 (two) times daily.    Dispense:  45 g    Refill:  5   sennosides-docusate sodium (SENOKOT-S) 8.6-50 MG tablet    Sig: Take 1-2 tablets by mouth at bedtime.    Dispense:  60 tablet    Refill:  11   Referral Orders         Ambulatory referral to General Surgery       Electronically signed by: Felix Pacini, DO [facility] [place]

## 2023-07-01 NOTE — Patient Instructions (Addendum)

## 2023-08-12 DIAGNOSIS — K5900 Constipation, unspecified: Secondary | ICD-10-CM | POA: Diagnosis not present

## 2023-08-12 DIAGNOSIS — K625 Hemorrhage of anus and rectum: Secondary | ICD-10-CM | POA: Diagnosis not present

## 2023-08-22 DIAGNOSIS — K5909 Other constipation: Secondary | ICD-10-CM | POA: Diagnosis not present

## 2023-08-22 DIAGNOSIS — K625 Hemorrhage of anus and rectum: Secondary | ICD-10-CM | POA: Diagnosis not present

## 2023-09-12 DIAGNOSIS — K625 Hemorrhage of anus and rectum: Secondary | ICD-10-CM | POA: Diagnosis not present

## 2023-09-12 LAB — HM COLONOSCOPY

## 2023-12-16 ENCOUNTER — Ambulatory Visit: Payer: 59 | Admitting: Family Medicine

## 2023-12-30 ENCOUNTER — Ambulatory Visit (INDEPENDENT_AMBULATORY_CARE_PROVIDER_SITE_OTHER): Payer: 59 | Admitting: Family Medicine

## 2023-12-30 ENCOUNTER — Encounter: Payer: Self-pay | Admitting: Family Medicine

## 2023-12-30 VITALS — BP 104/68 | HR 77 | Temp 98.3°F | Wt 122.6 lb

## 2023-12-30 DIAGNOSIS — F419 Anxiety disorder, unspecified: Secondary | ICD-10-CM

## 2023-12-30 DIAGNOSIS — J301 Allergic rhinitis due to pollen: Secondary | ICD-10-CM

## 2023-12-30 DIAGNOSIS — L308 Other specified dermatitis: Secondary | ICD-10-CM

## 2023-12-30 MED ORDER — LEVOCETIRIZINE DIHYDROCHLORIDE 5 MG PO TABS: 5.0000 mg | ORAL_TABLET | Freq: Every evening | ORAL | 3 refills | Status: DC

## 2023-12-30 MED ORDER — TRIAMCINOLONE ACETONIDE 0.1 % EX CREA: 1.0000 | TOPICAL_CREAM | Freq: Two times a day (BID) | CUTANEOUS | 5 refills | Status: DC

## 2023-12-30 MED ORDER — ESCITALOPRAM OXALATE 10 MG PO TABS: 10.0000 mg | ORAL_TABLET | Freq: Every day | ORAL | 1 refills | Status: DC

## 2023-12-30 NOTE — Progress Notes (Signed)
 Patient ID: Cindy Thompson, female  DOB: 09-27-1986, 38 y.o.   MRN: [id number] Patient Care Team    Relationship Specialty Notifications Start End  Natalia Leatherwood, DO PCP - General Family Medicine  04/11/16   Cheral Marker, CNM Midwife Obstetrics and Gynecology  03/06/21     Chief Complaint  Patient presents with   Anxiety    Subjective: Ananda Caya is a 38 y.o.  Female  present for Chronic Conditions/illness Management  All past medical history, surgical history, allergies, family history, immunizations, medications and social history were updated in the electronic medical record today. All recent labs, ED visits and hospitalizations within the last year were reviewed.   Gastroesophageal reflux disease without esophagitis Pt reports condition is stable with use of PPI prn  Anxiety Pt reports compliance  with lexapro 10 mg every day and feels medication helps her a great deal.   eczema Uses kenalog cream for flares.   Chronic seasonal allergic rhinitis due to pollen Uses zyrtec nightly.      12/30/2023    2:04 PM 07/01/2023    1:04 PM 02/07/2023    3:34 PM 01/14/2023    1:10 PM 10/16/2022    1:47 PM  Depression screen PHQ 2/9  Decreased Interest 0 0 0 0 0  Down, Depressed, Hopeless 0 0 0 0 0  PHQ - 2 Score 0 0 0 0 0  Altered sleeping 0 0 0 0   Tired, decreased energy 0 0 0 0   Change in appetite 0 0 0 0   Feeling bad or failure about yourself  0 0 0 0   Trouble concentrating 0 0 0 0   Moving slowly or fidgety/restless 0 0 0 0   Suicidal thoughts 0 0 0 0   PHQ-9 Score 0 0 0 0   Difficult doing work/chores Not difficult at all Not difficult at all Not difficult at all Not difficult at all       12/30/2023    2:04 PM 07/01/2023    1:04 PM 01/14/2023    1:10 PM 06/25/2022    1:29 PM  GAD 7 : Generalized Anxiety Score  Nervous, Anxious, on Edge 0 0 0 0  Control/stop worrying 0 0 0 0  Worry too much - different things 0 0 0 0  Trouble relaxing 0 0 0 0  Restless 0 0 0 0   Easily annoyed or irritable 0 0 0 0  Afraid - awful might happen 0 0 0 0  Total GAD 7 Score 0 0 0 0  Anxiety Difficulty Not difficult at all Not difficult at all Not difficult at all      Immunization History  Administered Date(s) Administered   Influenza, Seasonal, Injecte, Preservative Fre 07/01/2023   Influenza,inj,Quad PF,6+ Mos 07/12/2014, 06/25/2022   Influenza,inj,quad, With Preservative 07/20/2019   PFIZER(Purple Top)SARS-COV-2 Vaccination 12/31/2019, 01/22/2020, 09/09/2020   Tdap 01/13/2013, 07/01/2023   Past Medical History:  Diagnosis Date   Acne vulgaris 03/04/2015   Allergic urticaria 02/04/2018   Allergy    Angio-edema    COVID-19 virus detected 04/10/2019   W/ sob and hemoptysis    Eczema 03/06/2021   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 No pertinent surgical history. 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 She lives in [place]. Her mother helps her with children.     Allergies as of 12/30/2023       Reactions   Crab [shellfish Allergy] Rash        Medication List        Accurate as of December 30, 2023  2:11 PM. If you have any questions, ask your nurse or doctor.          ALIGN PO Take by mouth.   escitalopram 10 MG tablet Commonly known as: Lexapro Take 1 tablet (10 mg total) by mouth daily.   levocetirizine 5 MG tablet Commonly known as: XYZAL Take 1 tablet (5 mg total) by mouth every evening.   omeprazole 20 MG capsule Commonly known as: PRILOSEC Take 2  capsules (40 mg total) by mouth daily.   sennosides-docusate sodium 8.6-50 MG tablet Commonly known as: SENOKOT-S Take 1-2 tablets by mouth at bedtime.   triamcinolone cream 0.1 % Commonly known as: KENALOG Apply 1 Application topically 2 (two) times daily.        All past medical history, surgical history, allergies, family history, immunizations andmedications were updated in the EMR today and reviewed under the history and medication portions of their EMR.     No results found for this or any previous visit (from the past 2160 hours).   ROS: 14 pt review of systems performed and negative (unless mentioned in an HPI)  Objective: BP 104/68   Pulse 77   Temp 98.3 F (36.8 C)   Wt 122 lb 9.6 oz (55.6 kg)   SpO2 97%   BMI 24.23 kg/m  Physical Exam Vitals and nursing note reviewed.  Constitutional:      General: She is not in acute distress.    Appearance: Normal appearance. She is not ill-appearing, toxic-appearing or diaphoretic.  HENT:     Head: Normocephalic and atraumatic.  Eyes:     General: No scleral icterus.       Right eye: No discharge.        Left eye: No discharge.     Extraocular Movements: Extraocular movements intact.     Conjunctiva/sclera: Conjunctivae normal.     Pupils: Pupils are equal, round, and reactive to light.  Cardiovascular:     Rate and Rhythm: Normal rate and regular rhythm.  Pulmonary:     Effort: Pulmonary effort is normal. No respiratory distress.     Breath sounds: Normal breath sounds. No wheezing, rhonchi or rales.  Musculoskeletal:     Right lower leg: No edema.     Left lower leg: No edema.  Skin:    General: Skin is warm.     Findings: No rash.  Neurological:     Mental Status: She is alert and oriented to person, place, and time. Mental status is at baseline.     Motor: No weakness.     Gait: Gait normal.  Psychiatric:        Mood and Affect: Mood normal.        Behavior: Behavior normal.        Thought Content:  Thought content normal.        Judgment: Judgment normal.  No results found.  Assessment/plan: Samanthajo Payano is a 38 y.o. female present for Chronic Conditions/illness Management Gastroesophageal reflux disease, unspecified whether esophagitis present Stable Continue e omeprazole prn  Eczema:  Continue Kenalog cream PRN prescribed.   Anxiety: Stable Continue  Lexapro 10 mg daily   Return in about 6 months (around 07/01/2024) for cpe (20 min), Routine chronic condition follow-up.  No orders of the defined types were placed in this encounter.  Meds ordered this encounter  Medications   escitalopram (LEXAPRO) 10 MG tablet    Sig: Take 1 tablet (10 mg total) by mouth daily.    Dispense:  90 tablet    Refill:  1   levocetirizine (XYZAL) 5 MG tablet    Sig: Take 1 tablet (5 mg total) by mouth every evening.    Dispense:  90 tablet    Refill:  3   triamcinolone cream (KENALOG) 0.1 %    Sig: Apply 1 Application topically 2 (two) times daily.    Dispense:  45 g    Refill:  5   Referral Orders  No referral(s) requested today      Electronically signed by: Felix Pacini, DO [facility] [place]

## 2023-12-30 NOTE — Patient Instructions (Signed)
 Return in about 6 months (around 07/01/2024) for cpe (20 min), Routine chronic condition follow-up.        Great to see you today.  I have refilled the medication(s) we provide.   If labs were collected or images ordered, we will inform you of  results once we have received them and reviewed. We will contact you either by echart message, or telephone call.  Please give ample time to the testing facility, and our office to run,  receive and review results. Please do not call inquiring of results, even if you can see them in your chart. We will contact you as soon as we are able. If it has been over 1 week since the test was completed, and you have not yet heard from [place], then please call us.    - echart message- for normal results that have been seen by the patient already.   - telephone call: abnormal results or if patient has not viewed results in their echart.  If a referral to a specialist was entered for you, please call us in 2 weeks if you have not heard from the specialist office to schedule.

## 2023-12-31 ENCOUNTER — Telehealth: Payer: Self-pay

## 2023-12-31 NOTE — Telephone Encounter (Signed)
 Pt was wondering what the change is for this year as insurance has always covered our office. Spoke with pt and advised her to contact insurance to discuss insurance network because it appears insurance is out of network. Pt is aware that last appt will be billed to her.

## 2023-12-31 NOTE — Telephone Encounter (Signed)
 Copied from CRM [id number]. Topic: General - Other >> Dec 31, 2023  4:30 PM Eunice Blase wrote: Reason for CRM: Received call from [facility] per John ph: [phone] stated pt is out of network with [facility] local [address] with [facility]. Please contact [employer].

## 2024-07-06 ENCOUNTER — Ambulatory Visit: Admitting: Family Medicine

## 2024-07-06 ENCOUNTER — Encounter: Payer: Self-pay | Admitting: Family Medicine

## 2024-07-06 VITALS — BP 104/70 | HR 76 | Temp 98.1°F | Ht 59.0 in | Wt 115.4 lb

## 2024-07-06 DIAGNOSIS — F419 Anxiety disorder, unspecified: Secondary | ICD-10-CM | POA: Diagnosis not present

## 2024-07-06 DIAGNOSIS — Z975 Presence of (intrauterine) contraceptive device: Secondary | ICD-10-CM | POA: Diagnosis not present

## 2024-07-06 DIAGNOSIS — Z13220 Encounter for screening for lipoid disorders: Secondary | ICD-10-CM

## 2024-07-06 DIAGNOSIS — L308 Other specified dermatitis: Secondary | ICD-10-CM

## 2024-07-06 DIAGNOSIS — K219 Gastro-esophageal reflux disease without esophagitis: Secondary | ICD-10-CM

## 2024-07-06 DIAGNOSIS — Z23 Encounter for immunization: Secondary | ICD-10-CM | POA: Diagnosis not present

## 2024-07-06 DIAGNOSIS — Z793 Long term (current) use of hormonal contraceptives: Secondary | ICD-10-CM

## 2024-07-06 DIAGNOSIS — Z0000 Encounter for general adult medical examination without abnormal findings: Secondary | ICD-10-CM

## 2024-07-06 DIAGNOSIS — Z131 Encounter for screening for diabetes mellitus: Secondary | ICD-10-CM

## 2024-07-06 DIAGNOSIS — Z1231 Encounter for screening mammogram for malignant neoplasm of breast: Secondary | ICD-10-CM

## 2024-07-06 DIAGNOSIS — J301 Allergic rhinitis due to pollen: Secondary | ICD-10-CM | POA: Diagnosis not present

## 2024-07-06 MED ORDER — TRIAMCINOLONE ACETONIDE 0.1 % EX CREA: 1.0000 | TOPICAL_CREAM | Freq: Two times a day (BID) | CUTANEOUS | 5 refills | Status: AC

## 2024-07-06 MED ORDER — ESCITALOPRAM OXALATE 10 MG PO TABS: 10.0000 mg | ORAL_TABLET | Freq: Every day | ORAL | 1 refills | Status: AC

## 2024-07-06 MED ORDER — LEVOCETIRIZINE DIHYDROCHLORIDE 5 MG PO TABS: 5.0000 mg | ORAL_TABLET | Freq: Every evening | ORAL | 3 refills | Status: AC

## 2024-07-06 MED ORDER — SENNA-DOCUSATE SODIUM 8.6-50 MG PO TABS: 1.0000 | ORAL_TABLET | Freq: Every day | ORAL | 11 refills | Status: AC

## 2024-07-06 MED ORDER — OMEPRAZOLE 40 MG PO CPDR: 40.0000 mg | DELAYED_RELEASE_CAPSULE | Freq: Every day | ORAL | 1 refills | Status: AC

## 2024-07-06 NOTE — Patient Instructions (Addendum)
 Return in about 25 weeks (around 12/28/2024) for Routine chronic condition follow-up. And 1.5 mos for Hep B #2 vaccine- nurse visit       Great to see you today.  I have refilled the medication(s) we provide.   If labs were collected or images ordered, we will inform you of  results once we have received them and reviewed. We will contact you either by echart message, or telephone call.  Please give ample time to the testing facility, and our office to run,  receive and review results. Please do not call inquiring of results, even if you can see them in your chart. We will contact you as soon as we are able. If it has been over 1 week since the test was completed, and you have not yet heard from us , then please call us .    - echart message- for normal results that have been seen by the patient already.   - telephone call: abnormal results or if patient has not viewed results in their echart.  If a referral to a specialist was entered for you, please call us  in 2 weeks if you have not heard from the specialist office to schedule.

## 2024-07-06 NOTE — Progress Notes (Signed)
 Patient ID: Cindy Thompson, female  DOB: 1986/02/06, 38 y.o.   MRN: [id number] Patient Care Team    Relationship Specialty Notifications Start End  Catherine Charlies LABOR, DO PCP - General Family Medicine  04/11/16   Kizzie Suzen SAUNDERS, CNM Midwife Obstetrics and Gynecology  03/06/21     Chief Complaint  Patient presents with   Annual Exam    Chronic condition management Influenza vaccine- given Discussed hepatitis B (2 series) vaccine- given Mammogram-schedule at mammogram bus if desired- [facility]     Subjective: Cindy Thompson is a 38 y.o.  Female  present for CPE and chronic Conditions/illness Management  All past medical history, surgical history, allergies, family history, immunizations, medications and social history were updated in the electronic medical record today. All recent labs, ED visits and hospitalizations within the last year were reviewed.  Health maintenance:  Colonoscopy: screen at 43, no fhx Mammogram: start screen at 38, no fhx- ordered for [facility] Cervical cancer screening: completed with GYN; 2022. K. Booker CNM 5-year repeat Immunizations: tdap UTD 06/2023, influenza-given(encouraged yearly), hepatitis B-given Infectious disease screening: HIV completed 2015; hep c completed DEXA: screen at 58 Assistive device: none Oxygen [id number] Patient has a Dental home. Hospitalizations/ED visits: Reviewed  Gastroesophageal reflux disease without esophagitis Pt reports condition is stable with use of PPI prn  Anxiety Pt reports compliance with lexapro  10 mg every day and feels medication helps her a great deal  eczema Uses kenalog  cream for flares.   Chronic seasonal allergic rhinitis due to pollen Uses zyrtec  nightly.      07/06/2024    1:48 PM 12/30/2023    2:04 PM 07/01/2023    1:04 PM 02/07/2023    3:34 PM 01/14/2023    1:10 PM  Depression screen PHQ 2/9  Decreased Interest 0 0 0 0 0  Down, Depressed, Hopeless 0 0 0 0 0  PHQ - 2 Score 0 0 0 0 0  Altered sleeping 0 0  0 0 0  Tired, decreased energy 0 0 0 0 0  Change in appetite 0 0 0 0 0  Feeling bad or failure about yourself  0 0 0 0 0  Trouble concentrating 0 0 0 0 0  Moving slowly or fidgety/restless 0 0 0 0 0  Suicidal thoughts 0 0 0 0 0  PHQ-9 Score 0 0 0 0 0  Difficult doing work/chores Not difficult at all Not difficult at all Not difficult at all Not difficult at all Not difficult at all      07/06/2024    1:48 PM 12/30/2023    2:04 PM 07/01/2023    1:04 PM 01/14/2023    1:10 PM  GAD 7 : Generalized Anxiety Score  Nervous, Anxious, on Edge 0 0 0 0  Control/stop worrying 0 0 0 0  Worry too much - different things 0 0 0 0  Trouble relaxing 0 0 0 0  Restless 0 0 0 0  Easily annoyed or irritable 0 0 0 0  Afraid - awful might happen 0 0 0 0  Total GAD 7 Score 0 0 0 0  Anxiety Difficulty Not difficult at all Not difficult at all Not difficult at all Not difficult at all     Immunization History  Administered Date(s) Administered   Hepb-cpg 07/06/2024   Influenza, Seasonal, Injecte, Preservative Fre 07/01/2023, 07/06/2024   Influenza,inj,Quad PF,6+ Mos 07/12/2014, 06/25/2022   Influenza,inj,quad, With Preservative 07/20/2019   Moderna Sars-Covid-2 Vaccination 09/09/2020   PFIZER(Purple  Top)SARS-COV-2 Vaccination 12/31/2019, 01/22/2020, 09/09/2020   Tdap 01/13/2013, 07/01/2023   Past Medical History:  Diagnosis Date   Acne vulgaris 03/04/2015   Allergic urticaria 02/04/2018   Allergy    Angio-edema    COVID-19 virus detected 04/10/2019   W/ sob and hemoptysis    Eczema 03/06/2021   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 No pertinent surgical history. 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 She lives in [place]. Her mother helps her with children.     Allergies as of 07/06/2024       Reactions   Crab [shellfish Allergy] Rash        Medication List        Accurate as of July 06, 2024  2:06 PM. If you have any questions, ask your nurse or doctor.          STOP taking these medications    mometasone 0.1 % lotion Commonly known as: ELOCON Stopped by: Charlies Bellini       TAKE these medications    ALIGN PO Take by mouth.   escitalopram  10 MG tablet Commonly known as: Lexapro  Take 1 tablet (10 mg total) by mouth daily.   levocetirizine 5 MG tablet Commonly known as: XYZAL  Take 1 tablet (5 mg total) by mouth every evening.   NEXPLANON  [place] Inject into the skin.   omeprazole  40 MG capsule Commonly known as: PRILOSEC Take 1 capsule (40 mg total) by mouth daily. What changed: medication strength Changed by: Macyn Remmert   sennosides-docusate sodium  8.6-50 MG tablet Commonly known as: SENOKOT-S Take 1-2 tablets by mouth at bedtime.   triamcinolone  cream 0.1 % Commonly known as: KENALOG  Apply 1 Application topically 2 (two) times daily.        All past medical history, surgical history, allergies, family history, immunizations andmedications were updated in the EMR today and reviewed under the history and medication portions of their EMR.     No results found for this or any previous visit (from the past 2160 hours).   ROS: 14 pt review of systems performed and negative (unless mentioned in an HPI)  Objective: BP 104/70   Pulse 76   Temp 98.1 F (36.7 C)   Ht 4' 11 (1.499 m)   Wt 115 lb 6.4 oz (52.3 kg)   SpO2 99%   BMI 23.31 kg/m  Physical Exam Vitals and nursing note reviewed.  Constitutional:      General: She is not in acute distress.     Appearance: Normal appearance. She is not ill-appearing or toxic-appearing.  HENT:     Head: Normocephalic and atraumatic.     Right Ear: Tympanic membrane, ear canal and external ear normal. There is no impacted cerumen.     Left Ear: Tympanic membrane, ear canal and external ear normal. There is no impacted cerumen.     Nose: No congestion or rhinorrhea.     Mouth/Throat:     Mouth: Mucous membranes are moist.  Pharynx: Oropharynx is clear. No oropharyngeal exudate or posterior oropharyngeal erythema.  Eyes:     General: No scleral icterus.       Right eye: No discharge.        Left eye: No discharge.     Extraocular Movements: Extraocular movements intact.     Conjunctiva/sclera: Conjunctivae normal.     Pupils: Pupils are equal, round, and reactive to light.  Cardiovascular:     Rate and Rhythm: Normal rate and regular rhythm.     Pulses: Normal pulses.     Heart sounds: Normal heart sounds. No murmur heard.    No friction rub. No gallop.  Pulmonary:     Effort: Pulmonary effort is normal. No respiratory distress.     Breath sounds: Normal breath sounds. No stridor. No wheezing, rhonchi or rales.  Chest:     Chest wall: No tenderness.  Abdominal:     General: Abdomen is flat. Bowel sounds are normal. There is no distension.     Palpations: Abdomen is soft. There is no mass.     Tenderness: There is no abdominal tenderness. There is no right CVA tenderness, left CVA tenderness, guarding or rebound.     Hernia: No hernia is present.  Musculoskeletal:        General: No swelling, tenderness or deformity. Normal range of motion.     Cervical back: Normal range of motion and neck supple. No rigidity or tenderness.     Right lower leg: No edema.     Left lower leg: No edema.  Lymphadenopathy:     Cervical: No cervical adenopathy.  Skin:    General: Skin is warm and dry.     Coloration: Skin is not jaundiced or pale.     Findings: No bruising, erythema, lesion or rash.   Neurological:     General: No focal deficit present.     Mental Status: She is alert and oriented to person, place, and time. Mental status is at baseline.     Cranial Nerves: No cranial nerve deficit.     Sensory: No sensory deficit.     Motor: No weakness.     Coordination: Coordination normal.     Gait: Gait normal.     Deep Tendon Reflexes: Reflexes normal.  Psychiatric:        Mood and Affect: Mood normal.        Behavior: Behavior normal.        Thought Content: Thought content normal.        Judgment: Judgment normal.     No results found.  Assessment/plan: Berlynn Moll is a 38 y.o. female present for CPE and chronic Conditions/illness Management Gastroesophageal reflux disease, unspecified whether esophagitis present Stable Continue omeprazole  prn  Eczema:  Stable Continue Kenalog  cream PRN prescribed.   Anxiety: Stable Continue Lexapro  10 mg daily   Chronic seasonal allergic rhinitis due to pollen Stable Continue xyzal   Influenza vaccine needed - Flu vaccine trivalent PF, 6mos and older(Flulaval,Afluria,Fluarix,Fluzone) Need for hepatitis B vaccination - Heplisav-B (HepB-CPG) Vaccine - nurse visit 6-8 weeks for 2nd dose Nexplanon  in place/Long term current use of hormonal contraceptive - Hemoglobin A1c - Lipid panel - TSH Breast cancer screening by mammogram - MM 3D SCREENING MAMMOGRAM BILATERAL BREAST; Future Diabetes mellitus screening - Hemoglobin A1c Lipid screening - Lipid panel   Routine general medical examination at a health care facility (Primary) - CBC - Comprehensive metabolic panel with GFR - TSH Colonoscopy: screen at 43, no fhx Mammogram:  start screen at 38, no fhx- ordered for [facility] Cervical cancer screening: completed with GYN; 2022. K. Booker CNM 5-year repeat Immunizations: tdap UTD 06/2023, influenza-given(encouraged yearly), hepatitis B-given Infectious disease screening: HIV completed 2015; hep c completed DEXA: screen at  58 Patient was encouraged to exercise greater than 150 minutes a week. Patient was encouraged to choose a diet filled with fresh fruits and vegetables, and lean meats. AVS provided to patient today for education/recommendation on gender specific health and safety maintenance.    Return in about 25 weeks (around 12/28/2024) for Routine chronic condition follow-up.  Orders Placed This Encounter  Procedures   MM 3D SCREENING MAMMOGRAM BILATERAL BREAST   Flu vaccine trivalent PF, 6mos and older(Flulaval,Afluria,Fluarix,Fluzone)   Heplisav-B (HepB-CPG) Vaccine   CBC   Comprehensive metabolic panel with GFR   Hemoglobin A1c   Lipid panel   TSH   Meds ordered this encounter  Medications   escitalopram  (LEXAPRO ) 10 MG tablet    Sig: Take 1 tablet (10 mg total) by mouth daily.    Dispense:  90 tablet    Refill:  1   omeprazole  (PRILOSEC) 40 MG capsule    Sig: Take 1 capsule (40 mg total) by mouth daily.    Dispense:  90 capsule    Refill:  1   sennosides-docusate sodium  (SENOKOT-S) 8.6-50 MG tablet    Sig: Take 1-2 tablets by mouth at bedtime.    Dispense:  60 tablet    Refill:  11   levocetirizine (XYZAL ) 5 MG tablet    Sig: Take 1 tablet (5 mg total) by mouth every evening.    Dispense:  90 tablet    Refill:  3   triamcinolone  cream (KENALOG ) 0.1 %    Sig: Apply 1 Application topically 2 (two) times daily.    Dispense:  45 g    Refill:  5   Referral Orders  No referral(s) requested today      Electronically signed by: Charlies Bellini, DO [facility]

## 2024-07-07 ENCOUNTER — Ambulatory Visit: Payer: Self-pay | Admitting: Family Medicine

## 2024-07-07 LAB — CBC
HCT: 38.8 % (ref 35.0–45.0)
Hemoglobin: 12.9 g/dL (ref 11.7–15.5)
MCH: 32.8 pg (ref 27.0–33.0)
MCHC: 33.2 g/dL (ref 32.0–36.0)
MCV: 98.7 fL (ref 80.0–100.0)
MPV: 10.5 fL (ref 7.5–12.5)
Platelets: 223 Thousand/uL (ref 140–400)
RBC: 3.93 Million/uL (ref 3.80–5.10)
RDW: 11.6 % (ref 11.0–15.0)
WBC: 6.5 Thousand/uL (ref 3.8–10.8)

## 2024-07-07 LAB — HEMOGLOBIN A1C
Hgb A1c MFr Bld: 5.3 % (ref <5.7)
Mean Plasma Glucose: 105 mg/dL
eAG (mmol/L): 5.8 mmol/L

## 2024-07-07 LAB — COMPREHENSIVE METABOLIC PANEL WITH GFR
AG Ratio: 1.6 (calc) (ref 1.0–2.5)
ALT: 14 U/L (ref 6–29)
AST: 13 U/L (ref 10–30)
Albumin: 4.4 g/dL (ref 3.6–5.1)
Alkaline phosphatase (APISO): 51 U/L (ref 31–125)
BUN: 14 mg/dL (ref 7–25)
CO2: 29 mmol/L (ref 20–32)
Calcium: 9.3 mg/dL (ref 8.6–10.2)
Chloride: 103 mmol/L (ref 98–110)
Creat: 0.72 mg/dL (ref 0.50–0.99)
Globulin: 2.8 g/dL (ref 1.9–3.7)
Glucose, Bld: 98 mg/dL (ref 65–99)
Potassium: 3.9 mmol/L (ref 3.5–5.3)
Sodium: 139 mmol/L (ref 135–146)
Total Bilirubin: 0.7 mg/dL (ref 0.2–1.2)
Total Protein: 7.2 g/dL (ref 6.1–8.1)
eGFR: 108 mL/min/1.73m2 (ref >=60)

## 2024-07-07 LAB — LIPID PANEL
Cholesterol: 174 mg/dL (ref <200)
HDL: 55 mg/dL (ref >=50)
LDL Cholesterol (Calc): 96 mg/dL
Non-HDL Cholesterol (Calc): 119 mg/dL (ref <130)
Total CHOL/HDL Ratio: 3.2 (calc) (ref <5.0)
Triglycerides: 130 mg/dL (ref <150)

## 2024-07-07 LAB — TSH: TSH: 1.23 m[IU]/L

## 2024-08-17 ENCOUNTER — Ambulatory Visit

## 2024-08-24 ENCOUNTER — Ambulatory Visit

## 2024-08-24 DIAGNOSIS — Z23 Encounter for immunization: Secondary | ICD-10-CM | POA: Diagnosis not present

## 2024-12-28 ENCOUNTER — Ambulatory Visit: Admitting: Family Medicine
